# Patient Record
Sex: Male | Born: 1965 | Race: White | Hispanic: No | Marital: Married | Smoking: Never smoker
Health system: Southern US, Community
[De-identification: ages and names within clinical notes are randomized; demographics above are authoritative.]

## PROBLEM LIST (undated history)

## (undated) DIAGNOSIS — K635 Polyp of colon: Secondary | ICD-10-CM

## (undated) DIAGNOSIS — G620 Drug-induced polyneuropathy: Secondary | ICD-10-CM

## (undated) DIAGNOSIS — N200 Calculus of kidney: Secondary | ICD-10-CM

## (undated) DIAGNOSIS — E785 Hyperlipidemia, unspecified: Secondary | ICD-10-CM

## (undated) DIAGNOSIS — C20 Malignant neoplasm of rectum: Secondary | ICD-10-CM

## (undated) HISTORY — PX: TONSILLECTOMY: SUR1361

## (undated) HISTORY — DX: Drug-induced polyneuropathy: G62.0

## (undated) HISTORY — DX: Malignant neoplasm of rectum: C20

## (undated) HISTORY — DX: Calculus of kidney: N20.0

## (undated) HISTORY — PX: HERNIA REPAIR: SHX51

## (undated) HISTORY — PX: UMBILICAL HERNIA REPAIR: SHX196

## (undated) HISTORY — PX: ILEOSTOMY CLOSURE: SHX1784

## (undated) HISTORY — DX: Hyperlipidemia, unspecified: E78.5

## (undated) HISTORY — PX: PORTACATH PLACEMENT: SHX2246

## (undated) HISTORY — DX: Polyp of colon: K63.5

## (undated) HISTORY — PX: ILEOSTOMY: SHX1783

---

## 2007-02-12 DIAGNOSIS — C20 Malignant neoplasm of rectum: Secondary | ICD-10-CM

## 2007-02-12 HISTORY — DX: Malignant neoplasm of rectum: C20

## 2007-06-15 ENCOUNTER — Ambulatory Visit: Payer: Self-pay | Admitting: Gastroenterology

## 2007-06-15 DIAGNOSIS — K625 Hemorrhage of anus and rectum: Secondary | ICD-10-CM | POA: Insufficient documentation

## 2007-06-15 LAB — CONVERTED CEMR LAB
ALT: 31 units/L (ref 0–53)
AST: 24 units/L (ref 0–37)
Albumin: 3.7 g/dL (ref 3.5–5.2)
Alkaline Phosphatase: 67 units/L (ref 39–117)
BUN: 9 mg/dL (ref 6–23)
Basophils Absolute: 0 10*3/uL (ref 0.0–0.1)
Chloride: 108 meq/L (ref 96–112)
Eosinophils Absolute: 0.3 10*3/uL (ref 0.0–0.7)
Eosinophils Relative: 4 % (ref 0.0–5.0)
HCT: 35.1 % — ABNORMAL LOW (ref 39.0–52.0)
MCHC: 32.2 g/dL (ref 30.0–36.0)
MCV: 74.6 fL — ABNORMAL LOW (ref 78.0–100.0)
Monocytes Absolute: 0.5 10*3/uL (ref 0.1–1.0)
Neutrophils Relative %: 66.1 % (ref 43.0–77.0)
Platelets: 285 10*3/uL (ref 150–400)
Potassium: 3.8 meq/L (ref 3.5–5.1)
RDW: 15.2 % — ABNORMAL HIGH (ref 11.5–14.6)
Sodium: 140 meq/L (ref 135–145)

## 2007-07-16 ENCOUNTER — Ambulatory Visit: Payer: Self-pay | Admitting: Gastroenterology

## 2007-07-16 ENCOUNTER — Encounter: Payer: Self-pay | Admitting: Gastroenterology

## 2007-07-17 ENCOUNTER — Telehealth: Payer: Self-pay | Admitting: Gastroenterology

## 2007-07-20 ENCOUNTER — Encounter: Payer: Self-pay | Admitting: Gastroenterology

## 2007-07-20 ENCOUNTER — Ambulatory Visit (HOSPITAL_COMMUNITY): Admission: RE | Admit: 2007-07-20 | Discharge: 2007-07-20 | Payer: Self-pay | Admitting: Gastroenterology

## 2007-07-21 ENCOUNTER — Telehealth: Payer: Self-pay | Admitting: Gastroenterology

## 2007-07-28 ENCOUNTER — Encounter: Payer: Self-pay | Admitting: Gastroenterology

## 2007-07-31 ENCOUNTER — Ambulatory Visit: Payer: Self-pay | Admitting: Oncology

## 2007-08-03 ENCOUNTER — Ambulatory Visit: Admission: RE | Admit: 2007-08-03 | Discharge: 2007-09-30 | Payer: Self-pay | Admitting: Radiation Oncology

## 2007-08-04 ENCOUNTER — Encounter: Payer: Self-pay | Admitting: Gastroenterology

## 2007-08-11 ENCOUNTER — Ambulatory Visit (HOSPITAL_COMMUNITY): Admission: RE | Admit: 2007-08-11 | Discharge: 2007-08-11 | Payer: Self-pay | Admitting: Oncology

## 2007-08-11 ENCOUNTER — Ambulatory Visit (HOSPITAL_COMMUNITY): Admission: RE | Admit: 2007-08-11 | Discharge: 2007-08-11 | Payer: Self-pay | Admitting: Surgery

## 2007-08-17 ENCOUNTER — Encounter: Payer: Self-pay | Admitting: Gastroenterology

## 2007-08-17 ENCOUNTER — Ambulatory Visit (HOSPITAL_COMMUNITY): Admission: RE | Admit: 2007-08-17 | Discharge: 2007-08-17 | Payer: Self-pay | Admitting: Oncology

## 2007-08-17 LAB — COMPREHENSIVE METABOLIC PANEL
BUN: 13 mg/dL (ref 6–23)
CO2: 22 mEq/L (ref 19–32)
Calcium: 8.9 mg/dL (ref 8.4–10.5)
Chloride: 104 mEq/L (ref 96–112)
Creatinine, Ser: 0.71 mg/dL (ref 0.40–1.50)
Glucose, Bld: 95 mg/dL (ref 70–99)
Total Bilirubin: 0.5 mg/dL (ref 0.3–1.2)

## 2007-08-17 LAB — CBC WITH DIFFERENTIAL/PLATELET
BASO%: 1.1 % (ref 0.0–2.0)
Basophils Absolute: 0.1 10*3/uL (ref 0.0–0.1)
HCT: 29.3 % — ABNORMAL LOW (ref 38.7–49.9)
HGB: 9.5 g/dL — ABNORMAL LOW (ref 13.0–17.1)
LYMPH%: 18.6 % (ref 14.0–48.0)
MCH: 22 pg — ABNORMAL LOW (ref 28.0–33.4)
MCHC: 32.3 g/dL (ref 32.0–35.9)
MONO#: 0.6 10*3/uL (ref 0.1–0.9)
NEUT%: 68.8 % (ref 40.0–75.0)
Platelets: 285 10*3/uL (ref 145–400)
WBC: 8.2 10*3/uL (ref 4.0–10.0)
lymph#: 1.5 10*3/uL (ref 0.9–3.3)

## 2007-08-17 LAB — CEA: CEA: 0.8 ng/mL (ref 0.0–5.0)

## 2007-08-17 LAB — LACTATE DEHYDROGENASE: LDH: 172 U/L (ref 94–250)

## 2007-08-24 LAB — CBC WITH DIFFERENTIAL/PLATELET
Basophils Absolute: 0 10*3/uL (ref 0.0–0.1)
Eosinophils Absolute: 0.3 10*3/uL (ref 0.0–0.5)
HCT: 30.4 % — ABNORMAL LOW (ref 38.7–49.9)
HGB: 9.5 g/dL — ABNORMAL LOW (ref 13.0–17.1)
MCV: 69.4 fL — ABNORMAL LOW (ref 81.6–98.0)
MONO%: 8 % (ref 0.0–13.0)
NEUT#: 4 10*3/uL (ref 1.5–6.5)
RDW: 16.6 % — ABNORMAL HIGH (ref 11.2–14.6)
lymph#: 1 10*3/uL (ref 0.9–3.3)

## 2007-08-31 LAB — CBC WITH DIFFERENTIAL/PLATELET
Basophils Absolute: 0 10*3/uL (ref 0.0–0.1)
Eosinophils Absolute: 0.3 10*3/uL (ref 0.0–0.5)
HGB: 9.6 g/dL — ABNORMAL LOW (ref 13.0–17.1)
LYMPH%: 13.7 % — ABNORMAL LOW (ref 14.0–48.0)
MCV: 69.7 fL — ABNORMAL LOW (ref 81.6–98.0)
MONO%: 12 % (ref 0.0–13.0)
NEUT#: 3.4 10*3/uL (ref 1.5–6.5)
Platelets: 240 10*3/uL (ref 145–400)

## 2007-08-31 LAB — COMPREHENSIVE METABOLIC PANEL
Alkaline Phosphatase: 71 U/L (ref 39–117)
BUN: 11 mg/dL (ref 6–23)
Glucose, Bld: 113 mg/dL — ABNORMAL HIGH (ref 70–99)
Total Bilirubin: 0.4 mg/dL (ref 0.3–1.2)

## 2007-08-31 LAB — CEA: CEA: 1.2 ng/mL (ref 0.0–5.0)

## 2007-09-07 LAB — CBC WITH DIFFERENTIAL/PLATELET
BASO%: 0.9 % (ref 0.0–2.0)
Basophils Absolute: 0 10*3/uL (ref 0.0–0.1)
EOS%: 7.5 % — ABNORMAL HIGH (ref 0.0–7.0)
HGB: 9.8 g/dL — ABNORMAL LOW (ref 13.0–17.1)
MCH: 23 pg — ABNORMAL LOW (ref 28.0–33.4)
MCHC: 31.1 g/dL — ABNORMAL LOW (ref 32.0–35.9)
MCV: 73.8 fL — ABNORMAL LOW (ref 81.6–98.0)
MONO%: 10.2 % (ref 0.0–13.0)
RBC: 4.28 10*6/uL (ref 4.20–5.71)
RDW: 25.3 % — ABNORMAL HIGH (ref 11.2–14.6)

## 2007-09-14 LAB — COMPREHENSIVE METABOLIC PANEL
BUN: 13 mg/dL (ref 6–23)
CO2: 21 mEq/L (ref 19–32)
Calcium: 9 mg/dL (ref 8.4–10.5)
Chloride: 104 mEq/L (ref 96–112)
Creatinine, Ser: 0.83 mg/dL (ref 0.40–1.50)
Glucose, Bld: 107 mg/dL — ABNORMAL HIGH (ref 70–99)
Total Bilirubin: 0.4 mg/dL (ref 0.3–1.2)

## 2007-09-14 LAB — CBC WITH DIFFERENTIAL/PLATELET
BASO%: 0.6 % (ref 0.0–2.0)
HCT: 34.3 % — ABNORMAL LOW (ref 38.7–49.9)
MCHC: 32 g/dL (ref 32.0–35.9)
MONO#: 0.5 10*3/uL (ref 0.1–0.9)
RBC: 4.48 10*6/uL (ref 4.20–5.71)
RDW: 29.4 % — ABNORMAL HIGH (ref 11.2–14.6)
WBC: 5.1 10*3/uL (ref 4.0–10.0)
lymph#: 0.5 10*3/uL — ABNORMAL LOW (ref 0.9–3.3)

## 2007-09-28 ENCOUNTER — Ambulatory Visit: Payer: Self-pay | Admitting: Oncology

## 2007-10-05 LAB — CBC WITH DIFFERENTIAL/PLATELET
BASO%: 0.4 % (ref 0.0–2.0)
Eosinophils Absolute: 1.1 10*3/uL — ABNORMAL HIGH (ref 0.0–0.5)
HCT: 34.9 % — ABNORMAL LOW (ref 38.7–49.9)
LYMPH%: 9.1 % — ABNORMAL LOW (ref 14.0–48.0)
MONO#: 0.5 10*3/uL (ref 0.1–0.9)
NEUT#: 3.8 10*3/uL (ref 1.5–6.5)
NEUT%: 63.7 % (ref 40.0–75.0)
Platelets: 280 10*3/uL (ref 145–400)
WBC: 6 10*3/uL (ref 4.0–10.0)
lymph#: 0.5 10*3/uL — ABNORMAL LOW (ref 0.9–3.3)

## 2007-10-22 ENCOUNTER — Encounter (INDEPENDENT_AMBULATORY_CARE_PROVIDER_SITE_OTHER): Payer: Self-pay | Admitting: Surgery

## 2007-10-22 ENCOUNTER — Inpatient Hospital Stay (HOSPITAL_COMMUNITY): Admission: RE | Admit: 2007-10-22 | Discharge: 2007-10-25 | Payer: Self-pay | Admitting: Surgery

## 2007-10-29 ENCOUNTER — Encounter: Admission: RE | Admit: 2007-10-29 | Discharge: 2007-10-29 | Payer: Self-pay | Admitting: Surgery

## 2007-11-03 ENCOUNTER — Encounter: Payer: Self-pay | Admitting: Gastroenterology

## 2007-11-19 ENCOUNTER — Ambulatory Visit: Payer: Self-pay | Admitting: Oncology

## 2007-11-23 LAB — COMPREHENSIVE METABOLIC PANEL
ALT: 20 U/L (ref 0–53)
AST: 23 U/L (ref 0–37)
Creatinine, Ser: 0.7 mg/dL (ref 0.40–1.50)
Sodium: 138 mEq/L (ref 135–145)
Total Bilirubin: 0.6 mg/dL (ref 0.3–1.2)

## 2007-11-23 LAB — CBC WITH DIFFERENTIAL/PLATELET
BASO%: 0.6 % (ref 0.0–2.0)
LYMPH%: 9.6 % — ABNORMAL LOW (ref 14.0–48.0)
MCHC: 31.4 g/dL — ABNORMAL LOW (ref 32.0–35.9)
MCV: 86.2 fL (ref 81.6–98.0)
MONO#: 0.4 10*3/uL (ref 0.1–0.9)
MONO%: 6.7 % (ref 0.0–13.0)
NEUT#: 4 10*3/uL (ref 1.5–6.5)
Platelets: 246 10*3/uL (ref 145–400)
RBC: 4.12 10*6/uL — ABNORMAL LOW (ref 4.20–5.71)
RDW: 19.3 % — ABNORMAL HIGH (ref 11.2–14.6)
WBC: 5.2 10*3/uL (ref 4.0–10.0)

## 2007-12-07 LAB — CBC WITH DIFFERENTIAL/PLATELET
Eosinophils Absolute: 0.2 10*3/uL (ref 0.0–0.5)
HCT: 31.9 % — ABNORMAL LOW (ref 38.7–49.9)
HGB: 10.4 g/dL — ABNORMAL LOW (ref 13.0–17.1)
LYMPH%: 14.6 % (ref 14.0–48.0)
MONO#: 0.5 10*3/uL (ref 0.1–0.9)
NEUT#: 2 10*3/uL (ref 1.5–6.5)
Platelets: 199 10*3/uL (ref 145–400)
RBC: 3.83 10*6/uL — ABNORMAL LOW (ref 4.20–5.71)
WBC: 3.3 10*3/uL — ABNORMAL LOW (ref 4.0–10.0)

## 2007-12-07 LAB — URINALYSIS, MICROSCOPIC - CHCC
Glucose: NEGATIVE g/dL
Nitrite: NEGATIVE
Specific Gravity, Urine: 1.02 (ref 1.003–1.035)

## 2007-12-07 LAB — COMPREHENSIVE METABOLIC PANEL
BUN: 9 mg/dL (ref 6–23)
CO2: 25 mEq/L (ref 19–32)
Creatinine, Ser: 0.78 mg/dL (ref 0.40–1.50)
Glucose, Bld: 121 mg/dL — ABNORMAL HIGH (ref 70–99)
Total Bilirubin: 0.7 mg/dL (ref 0.3–1.2)

## 2007-12-09 ENCOUNTER — Encounter: Payer: Self-pay | Admitting: Gastroenterology

## 2007-12-21 LAB — URINALYSIS, MICROSCOPIC - CHCC
Ketones: NEGATIVE mg/dL
Nitrite: NEGATIVE
Protein: 30 mg/dL
Specific Gravity, Urine: 1.025 (ref 1.003–1.035)
pH: 6 (ref 4.6–8.0)

## 2007-12-21 LAB — COMPREHENSIVE METABOLIC PANEL
CO2: 25 mEq/L (ref 19–32)
Calcium: 8.7 mg/dL (ref 8.4–10.5)
Chloride: 107 mEq/L (ref 96–112)
Creatinine, Ser: 0.78 mg/dL (ref 0.40–1.50)
Glucose, Bld: 134 mg/dL — ABNORMAL HIGH (ref 70–99)
Total Bilirubin: 0.7 mg/dL (ref 0.3–1.2)
Total Protein: 6.5 g/dL (ref 6.0–8.3)

## 2007-12-21 LAB — CBC WITH DIFFERENTIAL/PLATELET
BASO%: 1.6 % (ref 0.0–2.0)
Basophils Absolute: 0.1 10*3/uL (ref 0.0–0.1)
HCT: 34.7 % — ABNORMAL LOW (ref 38.7–49.9)
HGB: 11.2 g/dL — ABNORMAL LOW (ref 13.0–17.1)
LYMPH%: 19.8 % (ref 14.0–48.0)
MCH: 26.9 pg — ABNORMAL LOW (ref 28.0–33.4)
MCHC: 32.2 g/dL (ref 32.0–35.9)
MONO#: 0.5 10*3/uL (ref 0.1–0.9)
NEUT%: 57.9 % (ref 40.0–75.0)
Platelets: 143 10*3/uL — ABNORMAL LOW (ref 145–400)

## 2008-01-04 ENCOUNTER — Ambulatory Visit: Payer: Self-pay | Admitting: Oncology

## 2008-01-04 LAB — CBC WITH DIFFERENTIAL/PLATELET
BASO%: 0.7 % (ref 0.0–2.0)
EOS%: 3.6 % (ref 0.0–7.0)
Eosinophils Absolute: 0.1 10*3/uL (ref 0.0–0.5)
LYMPH%: 28.9 % (ref 14.0–48.0)
MCHC: 33.7 g/dL (ref 32.0–35.9)
MCV: 82.9 fL (ref 81.6–98.0)
MONO%: 21.2 % — ABNORMAL HIGH (ref 0.0–13.0)
NEUT#: 0.9 10*3/uL — ABNORMAL LOW (ref 1.5–6.5)
Platelets: 121 10*3/uL — ABNORMAL LOW (ref 145–400)
RBC: 3.85 10*6/uL — ABNORMAL LOW (ref 4.20–5.71)
RDW: 18.6 % — ABNORMAL HIGH (ref 11.2–14.6)

## 2008-01-04 LAB — COMPREHENSIVE METABOLIC PANEL
ALT: 26 U/L (ref 0–53)
Albumin: 3.6 g/dL (ref 3.5–5.2)
Alkaline Phosphatase: 86 U/L (ref 39–117)
CO2: 27 mEq/L (ref 19–32)
Glucose, Bld: 114 mg/dL — ABNORMAL HIGH (ref 70–99)
Potassium: 3.9 mEq/L (ref 3.5–5.3)
Sodium: 139 mEq/L (ref 135–145)
Total Bilirubin: 0.7 mg/dL (ref 0.3–1.2)
Total Protein: 6.3 g/dL (ref 6.0–8.3)

## 2008-01-11 LAB — COMPREHENSIVE METABOLIC PANEL
ALT: 27 U/L (ref 0–53)
Albumin: 3.3 g/dL — ABNORMAL LOW (ref 3.5–5.2)
CO2: 26 mEq/L (ref 19–32)
Glucose, Bld: 117 mg/dL — ABNORMAL HIGH (ref 70–99)
Potassium: 3.7 mEq/L (ref 3.5–5.3)
Sodium: 136 mEq/L (ref 135–145)
Total Bilirubin: 0.9 mg/dL (ref 0.3–1.2)
Total Protein: 6.5 g/dL (ref 6.0–8.3)

## 2008-01-11 LAB — CBC WITH DIFFERENTIAL/PLATELET
BASO%: 0.7 % (ref 0.0–2.0)
Eosinophils Absolute: 0.1 10*3/uL (ref 0.0–0.5)
LYMPH%: 14.3 % (ref 14.0–48.0)
MCHC: 33.2 g/dL (ref 32.0–35.9)
MONO#: 0.6 10*3/uL (ref 0.1–0.9)
NEUT#: 2.1 10*3/uL (ref 1.5–6.5)
Platelets: 152 10*3/uL (ref 145–400)
RBC: 4.24 10*6/uL (ref 4.20–5.71)
RDW: 19.5 % — ABNORMAL HIGH (ref 11.2–14.6)
WBC: 3.4 10*3/uL — ABNORMAL LOW (ref 4.0–10.0)
lymph#: 0.5 10*3/uL — ABNORMAL LOW (ref 0.9–3.3)

## 2008-01-25 LAB — CBC WITH DIFFERENTIAL/PLATELET
Basophils Absolute: 0.1 10*3/uL (ref 0.0–0.1)
Eosinophils Absolute: 0.2 10*3/uL (ref 0.0–0.5)
HCT: 34.1 % — ABNORMAL LOW (ref 38.7–49.9)
LYMPH%: 23 % (ref 14.0–48.0)
MCV: 80.5 fL — ABNORMAL LOW (ref 81.6–98.0)
MONO%: 20.5 % — ABNORMAL HIGH (ref 0.0–13.0)
NEUT#: 1.6 10*3/uL (ref 1.5–6.5)
NEUT%: 49.4 % (ref 40.0–75.0)
Platelets: 133 10*3/uL — ABNORMAL LOW (ref 145–400)
RBC: 4.24 10*6/uL (ref 4.20–5.71)

## 2008-01-25 LAB — COMPREHENSIVE METABOLIC PANEL
Alkaline Phosphatase: 80 U/L (ref 39–117)
BUN: 6 mg/dL (ref 6–23)
Creatinine, Ser: 0.62 mg/dL (ref 0.40–1.50)
Glucose, Bld: 103 mg/dL — ABNORMAL HIGH (ref 70–99)
Sodium: 137 mEq/L (ref 135–145)
Total Bilirubin: 0.7 mg/dL (ref 0.3–1.2)

## 2008-02-08 LAB — CBC WITH DIFFERENTIAL/PLATELET
Basophils Absolute: 0 10*3/uL (ref 0.0–0.1)
Eosinophils Absolute: 0.1 10*3/uL (ref 0.0–0.5)
HCT: 35.4 % — ABNORMAL LOW (ref 38.7–49.9)
HGB: 11.8 g/dL — ABNORMAL LOW (ref 13.0–17.1)
LYMPH%: 24.4 % (ref 14.0–48.0)
MCV: 82 fL (ref 81.6–98.0)
MONO#: 0.3 10*3/uL (ref 0.1–0.9)
MONO%: 18.3 % — ABNORMAL HIGH (ref 0.0–13.0)
NEUT#: 0.9 10*3/uL — ABNORMAL LOW (ref 1.5–6.5)
Platelets: 84 10*3/uL — ABNORMAL LOW (ref 145–400)
WBC: 1.8 10*3/uL — ABNORMAL LOW (ref 4.0–10.0)

## 2008-02-12 DIAGNOSIS — G620 Drug-induced polyneuropathy: Secondary | ICD-10-CM

## 2008-02-12 HISTORY — DX: Drug-induced polyneuropathy: G62.0

## 2008-02-12 HISTORY — PX: COLON SURGERY: SHX602

## 2008-02-16 LAB — CBC WITH DIFFERENTIAL/PLATELET
Basophils Absolute: 0 10*3/uL (ref 0.0–0.1)
EOS%: 4.7 % (ref 0.0–7.0)
Eosinophils Absolute: 0.2 10*3/uL (ref 0.0–0.5)
HCT: 37.8 % — ABNORMAL LOW (ref 38.7–49.9)
HGB: 12.4 g/dL — ABNORMAL LOW (ref 13.0–17.1)
MCH: 27.3 pg — ABNORMAL LOW (ref 28.0–33.4)
NEUT#: 2.6 10*3/uL (ref 1.5–6.5)
NEUT%: 65.3 % (ref 40.0–75.0)
RDW: 19.4 % — ABNORMAL HIGH (ref 11.2–14.6)
lymph#: 0.5 10*3/uL — ABNORMAL LOW (ref 0.9–3.3)

## 2008-02-16 LAB — COMPREHENSIVE METABOLIC PANEL
AST: 29 U/L (ref 0–37)
Albumin: 3.3 g/dL — ABNORMAL LOW (ref 3.5–5.2)
BUN: 9 mg/dL (ref 6–23)
CO2: 22 mEq/L (ref 19–32)
Calcium: 8.6 mg/dL (ref 8.4–10.5)
Chloride: 104 mEq/L (ref 96–112)
Creatinine, Ser: 0.74 mg/dL (ref 0.40–1.50)
Glucose, Bld: 128 mg/dL — ABNORMAL HIGH (ref 70–99)
Potassium: 3.6 mEq/L (ref 3.5–5.3)

## 2008-02-25 ENCOUNTER — Ambulatory Visit: Payer: Self-pay | Admitting: Oncology

## 2008-02-29 LAB — CBC WITH DIFFERENTIAL/PLATELET
BASO%: 0 % (ref 0.0–2.0)
Eosinophils Absolute: 0.2 10*3/uL (ref 0.0–0.5)
HCT: 36.3 % — ABNORMAL LOW (ref 38.7–49.9)
HGB: 12 g/dL — ABNORMAL LOW (ref 13.0–17.1)
LYMPH%: 12.6 % — ABNORMAL LOW (ref 14.0–48.0)
MCH: 27.5 pg — ABNORMAL LOW (ref 28.0–33.4)
MCHC: 33.1 g/dL (ref 32.0–35.9)
MONO%: 12.5 % (ref 0.0–13.0)
NEUT#: 2.3 10*3/uL (ref 1.5–6.5)
RBC: 4.38 10*6/uL (ref 4.20–5.71)
WBC: 3.4 10*3/uL — ABNORMAL LOW (ref 4.0–10.0)

## 2008-02-29 LAB — COMPREHENSIVE METABOLIC PANEL
Alkaline Phosphatase: 81 U/L (ref 39–117)
Glucose, Bld: 133 mg/dL — ABNORMAL HIGH (ref 70–99)
Sodium: 138 mEq/L (ref 135–145)
Total Bilirubin: 0.7 mg/dL (ref 0.3–1.2)
Total Protein: 6.7 g/dL (ref 6.0–8.3)

## 2008-03-14 LAB — CBC WITH DIFFERENTIAL/PLATELET
BASO%: 0.2 % (ref 0.0–2.0)
Eosinophils Absolute: 0.1 10*3/uL (ref 0.0–0.5)
MCV: 83.2 fL (ref 81.6–98.0)
MONO%: 15.3 % — ABNORMAL HIGH (ref 0.0–13.0)
NEUT#: 2 10*3/uL (ref 1.5–6.5)
RBC: 4.3 10*6/uL (ref 4.20–5.71)
RDW: 18.8 % — ABNORMAL HIGH (ref 11.2–14.6)
WBC: 2.9 10*3/uL — ABNORMAL LOW (ref 4.0–10.0)

## 2008-03-14 LAB — COMPREHENSIVE METABOLIC PANEL
ALT: 19 U/L (ref 0–53)
AST: 29 U/L (ref 0–37)
Albumin: 3.3 g/dL — ABNORMAL LOW (ref 3.5–5.2)
Alkaline Phosphatase: 95 U/L (ref 39–117)
Glucose, Bld: 128 mg/dL — ABNORMAL HIGH (ref 70–99)
Potassium: 3.8 mEq/L (ref 3.5–5.3)
Sodium: 139 mEq/L (ref 135–145)
Total Protein: 6.3 g/dL (ref 6.0–8.3)

## 2008-03-21 ENCOUNTER — Encounter: Admission: RE | Admit: 2008-03-21 | Discharge: 2008-03-21 | Payer: Self-pay | Admitting: Surgery

## 2008-03-28 LAB — COMPREHENSIVE METABOLIC PANEL
AST: 39 U/L — ABNORMAL HIGH (ref 0–37)
Albumin: 3.4 g/dL — ABNORMAL LOW (ref 3.5–5.2)
Alkaline Phosphatase: 87 U/L (ref 39–117)
Potassium: 3.1 mEq/L — ABNORMAL LOW (ref 3.5–5.3)
Sodium: 136 mEq/L (ref 135–145)
Total Bilirubin: 1.3 mg/dL — ABNORMAL HIGH (ref 0.3–1.2)
Total Protein: 6.8 g/dL (ref 6.0–8.3)

## 2008-03-28 LAB — CBC WITH DIFFERENTIAL/PLATELET
EOS%: 4.9 % (ref 0.0–7.0)
MCH: 27.6 pg — ABNORMAL LOW (ref 28.0–33.4)
MCHC: 33.6 g/dL (ref 32.0–35.9)
MCV: 82.2 fL (ref 81.6–98.0)
MONO%: 16.5 % — ABNORMAL HIGH (ref 0.0–13.0)
NEUT#: 1.5 10*3/uL (ref 1.5–6.5)
RBC: 3.97 10*6/uL — ABNORMAL LOW (ref 4.20–5.71)
RDW: 19.2 % — ABNORMAL HIGH (ref 11.2–14.6)

## 2008-04-04 LAB — COMPREHENSIVE METABOLIC PANEL
AST: 31 U/L (ref 0–37)
Alkaline Phosphatase: 78 U/L (ref 39–117)
BUN: 8 mg/dL (ref 6–23)
Calcium: 9 mg/dL (ref 8.4–10.5)
Chloride: 108 mEq/L (ref 96–112)
Creatinine, Ser: 0.71 mg/dL (ref 0.40–1.50)
Glucose, Bld: 147 mg/dL — ABNORMAL HIGH (ref 70–99)

## 2008-04-25 ENCOUNTER — Inpatient Hospital Stay (HOSPITAL_COMMUNITY): Admission: RE | Admit: 2008-04-25 | Discharge: 2008-04-27 | Payer: Self-pay | Admitting: Surgery

## 2008-05-09 ENCOUNTER — Ambulatory Visit: Payer: Self-pay | Admitting: Oncology

## 2008-05-12 LAB — BASIC METABOLIC PANEL
BUN: 9 mg/dL (ref 6–23)
Chloride: 103 mEq/L (ref 96–112)
Creatinine, Ser: 0.77 mg/dL (ref 0.40–1.50)

## 2008-05-18 ENCOUNTER — Encounter: Payer: Self-pay | Admitting: Gastroenterology

## 2008-06-23 ENCOUNTER — Ambulatory Visit: Payer: Self-pay | Admitting: Oncology

## 2008-06-23 LAB — BASIC METABOLIC PANEL
BUN: 11 mg/dL (ref 6–23)
CO2: 26 mEq/L (ref 19–32)
Chloride: 106 mEq/L (ref 96–112)
Creatinine, Ser: 0.73 mg/dL (ref 0.40–1.50)

## 2008-07-04 ENCOUNTER — Encounter: Admission: RE | Admit: 2008-07-04 | Discharge: 2008-07-04 | Payer: Self-pay | Admitting: Oncology

## 2008-07-14 ENCOUNTER — Encounter (INDEPENDENT_AMBULATORY_CARE_PROVIDER_SITE_OTHER): Payer: Self-pay | Admitting: *Deleted

## 2008-10-24 ENCOUNTER — Ambulatory Visit: Payer: Self-pay | Admitting: Gastroenterology

## 2008-11-14 ENCOUNTER — Encounter: Payer: Self-pay | Admitting: Gastroenterology

## 2008-11-14 ENCOUNTER — Ambulatory Visit: Payer: Self-pay | Admitting: Gastroenterology

## 2008-11-16 ENCOUNTER — Encounter: Payer: Self-pay | Admitting: Gastroenterology

## 2008-11-24 ENCOUNTER — Ambulatory Visit: Payer: Self-pay | Admitting: Oncology

## 2009-01-19 ENCOUNTER — Ambulatory Visit: Payer: Self-pay | Admitting: Oncology

## 2009-01-23 LAB — CBC WITH DIFFERENTIAL/PLATELET
Basophils Absolute: 0 10*3/uL (ref 0.0–0.1)
Eosinophils Absolute: 0.3 10*3/uL (ref 0.0–0.5)
HCT: 37.1 % — ABNORMAL LOW (ref 38.4–49.9)
LYMPH%: 15.3 % (ref 14.0–49.0)
MONO#: 0.5 10*3/uL (ref 0.1–0.9)
NEUT#: 3.3 10*3/uL (ref 1.5–6.5)
NEUT%: 69.1 % (ref 39.0–75.0)
Platelets: 213 10*3/uL (ref 140–400)
WBC: 4.7 10*3/uL (ref 4.0–10.3)

## 2009-02-27 ENCOUNTER — Other Ambulatory Visit: Payer: Self-pay | Admitting: Oncology

## 2009-02-27 ENCOUNTER — Ambulatory Visit: Payer: Self-pay | Admitting: Oncology

## 2009-02-27 LAB — URINALYSIS, MICROSCOPIC - CHCC
Ketones: NEGATIVE mg/dL
Protein: 30 mg/dL
Specific Gravity, Urine: 1.03 (ref 1.003–1.035)

## 2009-03-01 LAB — URINE CULTURE

## 2009-03-21 ENCOUNTER — Encounter: Payer: Self-pay | Admitting: Gastroenterology

## 2009-06-16 ENCOUNTER — Ambulatory Visit: Payer: Self-pay | Admitting: Oncology

## 2009-06-19 ENCOUNTER — Ambulatory Visit (HOSPITAL_COMMUNITY): Admission: RE | Admit: 2009-06-19 | Discharge: 2009-06-19 | Payer: Self-pay | Admitting: Oncology

## 2009-06-19 LAB — CBC WITH DIFFERENTIAL/PLATELET
Eosinophils Absolute: 0.2 10*3/uL (ref 0.0–0.5)
HGB: 12.4 g/dL — ABNORMAL LOW (ref 13.0–17.1)
MONO#: 0.4 10*3/uL (ref 0.1–0.9)
NEUT#: 3.3 10*3/uL (ref 1.5–6.5)
Platelets: 239 10*3/uL (ref 140–400)
RBC: 4.01 10*6/uL — ABNORMAL LOW (ref 4.20–5.82)
RDW: 17.1 % — ABNORMAL HIGH (ref 11.0–14.6)
WBC: 4.6 10*3/uL (ref 4.0–10.3)

## 2009-06-19 LAB — COMPREHENSIVE METABOLIC PANEL
Albumin: 4.1 g/dL (ref 3.5–5.2)
CO2: 27 mEq/L (ref 19–32)
Calcium: 9.2 mg/dL (ref 8.4–10.5)
Chloride: 104 mEq/L (ref 96–112)
Glucose, Bld: 113 mg/dL — ABNORMAL HIGH (ref 70–99)
Potassium: 3.7 mEq/L (ref 3.5–5.3)
Sodium: 137 mEq/L (ref 135–145)
Total Protein: 7.6 g/dL (ref 6.0–8.3)

## 2009-06-19 LAB — CEA: CEA: 1.7 ng/mL (ref 0.0–5.0)

## 2009-07-17 ENCOUNTER — Ambulatory Visit: Payer: Self-pay | Admitting: Oncology

## 2010-01-11 ENCOUNTER — Ambulatory Visit: Payer: Self-pay | Admitting: Oncology

## 2010-02-15 ENCOUNTER — Ambulatory Visit: Payer: Self-pay | Admitting: Oncology

## 2010-02-19 ENCOUNTER — Encounter: Payer: Self-pay | Admitting: Gastroenterology

## 2010-02-19 LAB — TESTOSTERONE: Testosterone: 192.26 ng/dL — ABNORMAL LOW (ref 250–890)

## 2010-02-19 LAB — CEA: CEA: 1.1 ng/mL (ref 0.0–5.0)

## 2010-02-19 LAB — CBC WITH DIFFERENTIAL/PLATELET
BASO%: 0.2 % (ref 0.0–2.0)
Basophils Absolute: 0 10*3/uL (ref 0.0–0.1)
EOS%: 6 % (ref 0.0–7.0)
Eosinophils Absolute: 0.2 10*3/uL (ref 0.0–0.5)
HCT: 37.5 % — ABNORMAL LOW (ref 38.4–49.9)
HGB: 12.4 g/dL — ABNORMAL LOW (ref 13.0–17.1)
LYMPH%: 18.1 % (ref 14.0–49.0)
MCH: 30.6 pg (ref 27.2–33.4)
MCHC: 33.1 g/dL (ref 32.0–36.0)
MCV: 92.4 fL (ref 79.3–98.0)
MONO#: 0.2 10*3/uL (ref 0.1–0.9)
MONO%: 5.9 % (ref 0.0–14.0)
NEUT#: 2.9 10*3/uL (ref 1.5–6.5)
NEUT%: 69.8 % (ref 39.0–75.0)
Platelets: 224 10*3/uL (ref 140–400)
RBC: 4.06 10*6/uL — ABNORMAL LOW (ref 4.20–5.82)
RDW: 19 % — ABNORMAL HIGH (ref 11.0–14.6)
WBC: 4.2 10*3/uL (ref 4.0–10.3)
lymph#: 0.7 10*3/uL — ABNORMAL LOW (ref 0.9–3.3)

## 2010-03-02 ENCOUNTER — Other Ambulatory Visit: Payer: Self-pay | Admitting: Oncology

## 2010-03-02 DIAGNOSIS — C20 Malignant neoplasm of rectum: Secondary | ICD-10-CM

## 2010-03-04 ENCOUNTER — Encounter: Payer: Self-pay | Admitting: Oncology

## 2010-03-04 ENCOUNTER — Encounter: Payer: Self-pay | Admitting: Gastroenterology

## 2010-03-13 NOTE — Letter (Signed)
Summary: [facility]  [facility]   Imported By: Lester Saratoga 03/31/2009 10:31:55  _____________________________________________________________________  External Attachment:    Type:   Image     Comment:   External Document

## 2010-03-15 NOTE — Letter (Signed)
Summary: [facility]  [facility]   Imported By: Sherian Rein 03/09/2010 09:03:14  _____________________________________________________________________  External Attachment:    Type:   Image     Comment:   External Document

## 2010-05-03 ENCOUNTER — Encounter (HOSPITAL_BASED_OUTPATIENT_CLINIC_OR_DEPARTMENT_OTHER): Payer: 59 | Admitting: Oncology

## 2010-05-03 DIAGNOSIS — C20 Malignant neoplasm of rectum: Secondary | ICD-10-CM

## 2010-05-24 ENCOUNTER — Telehealth: Payer: Self-pay | Admitting: *Deleted

## 2010-05-24 LAB — CBC
MCHC: 33 g/dL (ref 30.0–36.0)
RBC: 3.97 MIL/uL — ABNORMAL LOW (ref 4.22–5.81)
WBC: 2.9 10*3/uL — ABNORMAL LOW (ref 4.0–10.5)

## 2010-05-24 LAB — BASIC METABOLIC PANEL
CO2: 26 mEq/L (ref 19–32)
Calcium: 9.2 mg/dL (ref 8.4–10.5)
Creatinine, Ser: 0.75 mg/dL (ref 0.4–1.5)
GFR calc Af Amer: >60 mL/min (ref >60)
GFR calc non Af Amer: >60 mL/min (ref >60)

## 2010-05-24 NOTE — Telephone Encounter (Signed)
Pt is coming by tomorrow to get Dr Cato Mulligan to sign handicap form

## 2010-05-24 NOTE — Telephone Encounter (Signed)
Needs to speak to Arline Asp about when he can get his handicapped sticker signed.

## 2010-06-04 ENCOUNTER — Encounter (HOSPITAL_BASED_OUTPATIENT_CLINIC_OR_DEPARTMENT_OTHER): Payer: 59 | Admitting: Oncology

## 2010-06-04 DIAGNOSIS — R209 Unspecified disturbances of skin sensation: Secondary | ICD-10-CM

## 2010-06-04 DIAGNOSIS — G620 Drug-induced polyneuropathy: Secondary | ICD-10-CM

## 2010-06-04 DIAGNOSIS — C20 Malignant neoplasm of rectum: Secondary | ICD-10-CM

## 2010-06-26 NOTE — Op Note (Signed)
NAME:  Adam Wilson, Adam Wilson NO.:  [id number]   MEDICAL RECORD NO.:  [id number]          PATIENT TYPE:  INP   LOCATION:  0006                         FACILITY:  [facility]   PHYSICIAN:  Ardeth Sportsman, MD     DATE OF BIRTH:  Jun 26, 1965   DATE OF PROCEDURE:  04/25/2008  DATE OF DISCHARGE:                               OPERATIVE REPORT   GASTROENTEROLOGIST:  Barbette Hair. Arlyce Dice, MD,FACG   ONCOLOGIST:  Leighton Roach. Truett Perna, M.D.   RADIATION ONCOLOGIST:  Durene Romans. Lestine Box, MD   PREOPERATIVE DIAGNOSES:  Stage III rectal cancer, pT2, pN1, M0  adenocarcinoma of the rectum, status post neoadjuvant chemoradiation  therapy, laparoscopically assisted low anterior resection with loop  ileostomy, October 22, 2007; post adjuvant chemotherapy.   POSTOPERATIVE DIAGNOSES:  Stage III rectal cancer, pT2, pN1, pM0  adenocarcinoma of the rectum, status post neoadjuvant chemoradiation  therapy, laparoscopically assisted low anterior resection with loop  ileostomy, October 22, 2007; post adjuvant chemotherapy.   OPERATION PERFORMED:  Loop ileostomy takedown.   SURGEON:  Ardeth Sportsman, MD   ASSISTANT:  Velora Heckler, MD   ANESTHESIA:  1. General anesthesia.  2. Local anesthetic in a field block.   SPECIMENS:  Loop ileostomy (not sent).   DRAINS:  None.   ESTIMATED BLOOD LOSS:  Less than 15 mL.   COMPLICATIONS:  None apparent.   INDICATIONS FOR PROCEDURE:  Mr. Donald Pore is a pleasant 45 year old male  who was diagnosed with a rectal cancer.  He underwent resection and  treatment as above.  He seems to have recovered well from this.  He had  a followup barium enema which showed no evidence of any anastomotic leak  or stricture.  Recommendations made for loop ileostomy takedown to  return to normal continence.  Risks, benefits and alternatives were  discussed in detail.  Questions were answered and he agreed to proceed.   OPERATIVE FINDINGS:  He had no evidence of any significant  anterior  abdominal adhesions nor any incisional hernia.   DESCRIPTION OF PROCEDURE:  Informed consent was confirmed.  The patient  underwent general anesthesia without any difficulty.  He had a Foley  catheter sterilely placed.  He was positioned supine with his arms out.  His abdomen was prepped and draped in sterile fashion.  A surgical time  out confirmed our plan.   A biconcave elliptical horizontal incision was made around the  ileostomy.  Cautery was used to get through the subcutaneous tissues.  I  was able to help free the loop of ileum off the subcutaneous attachment  until we got to the level of the fascia.  I was able to counter through  the fascia out laterally and carefully come around the attachments in  the fascial defect to the loop of ileum.  Gradually we were able to free  this off using some blunt and sharp and focused cautery dissection.  With that, we were able to completely eviscerate about a foot and a half  of small intestine.  Then debridement was done on the serosa to  decompress the distal end  to get good length.   A side-to-side stapled anastomosis was performed using a staple GIA-75.  The common defect was closed using a TA-60 stapler.  About 4 inches of  loop of ileum were trimmed off that had been somewhat beaten up  including the ostomy itself.  Mesentery was trimmed off using clamps and  silk ties.  Mesenteric defect was closed using 2-0 silk figure-of-eight  stitches.  Hemostasis was excellent.  The anastomosis was patent.  There  were no mesenteric defects.  The loop was allowed to fall back in.   Copious irrigation was done once hemostasis was ensured in the abdominal  wound.  The incision in the fascial defect was closed using #1 looped  PDS in a running fashion with good result.  Copious irrigation of over 1  L of saline was done with clear return.  Hemostasis was excellent.  The  skin was reapproximated using 3-0 Monocryl interrupted stitches  x3.  The  wound was packed with some Telfa wicks and triple antibiotic ointment  was placed.  Sterile dressings were applied.   The patient was extubated and sent to recovery room in stable condition.  I discussed postoperative care with the patient in detail just prior to  surgery and his wife just after surgery.      Ardeth Sportsman, MD  Electronically Signed     SCG/MEDQ  D:  04/25/2008  T:  04/25/2008  Job:  [id number]   cc:   Barbette Hair. Arlyce Dice, MD,FACG  [address]  [place]  [place] [address]   Leighton Roach Truett Perna, M.D.  Fax: [phone]   Durene Romans. Lestine Box, MD  Fax: [phone]

## 2010-06-26 NOTE — Op Note (Signed)
NAME:  ERHARDT, DADA NO.:  [id number]   MEDICAL RECORD NO.:  [id number]          PATIENT TYPE:  OUT   LOCATION:  CATS                         FACILITY:  [facility]   PHYSICIAN:  Ardeth Sportsman, MD     DATE OF BIRTH:  07-28-1965   DATE OF PROCEDURE:  DATE OF DISCHARGE:                               OPERATIVE REPORT   PRIMARY CARE PHYSICIAN:  Does not have.   GASTROENTEROLOGIST:  Melvia Heaps.   RADIATION ONCOLOGIST:  Dr. Lestine Box.   MEDICATION ONCOLOGIST:  Leighton Roach. Truett Perna, M.D.   SURGEON:  Ardeth Sportsman, MD.   ASSISTANT:  Ovidio Kin.   PREOPERATIVE DIAGNOSIS:  Rectal cancer, need for preoperative  chemotherapy.   POSTOPERATIVE DIAGNOSIS:  Rectal cancer, need for preoperative  chemotherapy.   PROCEDURE PERFORMED:  Placement of Port-A-Cath.   ANESTHESIA:  1. General anesthesia.  2. Local anesthetic in a field block.   SPECIMENS:  None.   DRAINS:  An 8-French Power Port catheter goes from left internal jugular  vein with the tip in the proximal SVC.   ESTIMATED BLOOD LOSS:  30 mL.   COMPLICATIONS:  None apparent.   INDICATIONS:  Mr. Mainwaring is a 45 year old morbidly obese male with  rectal bleeding.  He was found to have a large  rectal cancer.  I felt  he would benefit from preoperative chemoradiation therapy and Drs.  Truett Perna and Lestine Box agreed and requested [employer] catheter placement.  The technique and placement were discussed under ultrasound and  radiographic guidance.  The risks, benefits, and alternatives were  discussed.  Questions were answered.  He agreed to proceed.   INTRAOPERATIVE FINDINGS:  He had very large intrathoracic vessels.  Initially they flipped up into right subclavian system but did flip back  down into the superior vena cava and rested at the proximal SVC.   The 8-French Power port catheter reference number is [id number].  Lot  number is [id number], expiration 07/19/2011.   DESCRIPTION OF PROCEDURE:  Consent was  confirmed.  The patient, because  severe anxiety and other issues plus doing a transrectal ultrasound at  the same time, recommendation was made for the patient, this was done  under general anesthesia.  He underwent general anesthesia without  difficulty.  He received IV cefazolin and IV Flagyl just prior to  incision.  He was positioned supine with the left arm tucked with a  large roll under his shoulder blades to accentuate his chest and allow  gentle neck flexion.  His left neck and chest were clipped and draped in  a sterile fashion.   Ultrasound was used to locate the left internal jugular vein.  Venipuncture was performed.  A wire was passed down under fluoroscopy.  Initially the wire curved in on itself and after even ablation I could  not get it to pass down.  Therefore we stopped and did a re-puncture  under ultrasound.  The wire passed under fluoro down into the right  ventricle without difficulty.  The 8-French Power Port was chosen.  A 2-  cm incision was made in the  infraclavicular region near the mid to  lateral clavicular line.  Dissection was done to make a subcutaneous  pocket.  The Power Port was secured to the catheter and secured at the  anterior fascia in 3 places using a 2-0 Prolene stitch.  The catheter  was tunneled out toward the neck incision and passed through a dilator  and sheath after appropriate intra-arterial length was done by using a  fluoroscopy and a guidewire.   On final fluoroscopy the tip was floating up and was going over to the  right subclavian.  Attempts to flush and readjust would not work.  Therefore, the port was removed out of the wound and released off the  catheter.  A guidewire was passed down the catheter and we could advance  it into the right atrium, however, could not get the catheter to  readvance.  Therefore, I removed the port catheter out of the  infraclavicular wound to the left neck wound and opened up the incision  about a  centimeter.  A stiffer guide wire was used and this was advanced  and I definitely went down into the right atrium.  the I re-tunneled the  catheter back into the infraclavicular wound.  We reattached the port.  The tip was then down in the distal superior vena cava.  The catheter  was secured in 3 places.  I aspirated and flushed.  On reinspection the  tip was in the proximal SVC.  It aspirated and flushed well.  A final  flush of heparin was done.  The wound was closed using a 4-0 Monocryl  deep and interrupted subcuticular stitches.  A sterile dressing was  applied.   At this point I turned the case over to Dr. Ezzard Standing to do the transrectal  ultrasound.  His is UT3, UT1.  See his note for further details.  I was  present for that entire case as well.      Ardeth Sportsman, MD  Electronically Signed     SCG/MEDQ  D:  08/11/2007  T:  08/11/2007  Job:  [id number]

## 2010-06-26 NOTE — Op Note (Signed)
NAME:  Adam Wilson, Adam Wilson               ACCOUNT NO.:  [id number]   MEDICAL RECORD NO.:  [id number]          PATIENT TYPE:  AMB   LOCATION:  DAY                          [facility]ACILITY:  [facility]   PHYSICIAN:  Sandria Bales. Ezzard Standing, M.D.  DATE OF BIRTH:  07-Aug-1965   DATE OF PROCEDURE:  08/11/2007  DATE OF DISCHARGE:                               OPERATIVE REPORT   Date of Surgery ??   PREOPERATIVE DIAGNOSIS:  A rectal cancer.   PREOPERATIVE DIAGNOSIS:  A uT3 uN1 carcinoma of the rectum starting at 9  cm from the anal verge, going to 16 cm from the anal verge, involving  the anterior 25% of the rectum, with least one abnormal lymph node  measuring 2 cm at the right posterior aspect of the rectum 17 cm from  the anal verge.   PROCEDURES:  1. Examination under anesthesia.  2. Transrectal ultrasound.   (Dr. Michaell Cowing will dictate a Port-A-Cath placement on the patient.)   SURGEON:  Sandria Bales. Ezzard Standing, MD   FIRST ASSISTANT:  Ardeth Sportsman, MD   ANESTHESIA:  General endotracheal.   ESTIMATED BLOOD LOSS:  Minimal.   PROCEDURE:  [name] is a 45 year old white male who is a patient of  Dr. Barnet Pall, who was noted to have some rectal bleeding.  He had a  colonoscopy.  Dr. Arlyce Dice found a lesion at 12 cm from the anus and  biopsies were consistent with adenocarcinoma.   Dr. Michaell Cowing is planing to put a Port-A-Cath in him for consideration for  neoadjuvant chemotherapy.  I am doing a transrectal ultrasound at the  same time for evaluation of the size and depth of the tumor.   On CT scan the patient had three suspicious lymph nodes in his pelvis.   PROCEDURE NOTE:  After Dr. Michaell Cowing had completed his left IJ Port-A-Cath,  the patient was placed in the lithotomy position.  On digital rectal  exam you could feel this tumor on exam.  It is really above the prostate  and seemed to be based anterior.   I then did a transrectal ultrasound, identified the top of the tumor at  between 16-17 cm from the anal  verge.  The tumor penetrated to a depth  of 2.5 cm anteriorly.  The bottom of the tumor was approximately 9-10 cm  from the anal verge and was immediately above the prostate or cranial to  the prostate gland.   I also identified a 2 cm lymph node right and posterior to the rectum at  17 cm from the anal verge.   I did not see the other two large nodes identified on CT scan.  They  were probably higher and out of the reach of the rectal ultrasound.   The patient tolerated the procedure well.  Dr. Michaell Cowing will dictate the  Port-A-Cath portion, I have dictated the ultrasound portion.      Sandria Bales. Ezzard Standing, M.D.  Electronically Signed     DHN/MEDQ  D:  08/11/2007  T:  08/11/2007  Job:  [id number]   cc:   Barbette Hair. Arlyce Dice, MD,FACG  [address]  [place]  [place] [address]   Leighton Roach Truett Perna, M.D.  Fax: [phone]   Leonides Grills, M.D.  Fax: [phone]

## 2010-06-26 NOTE — Op Note (Signed)
NAME:  Adam Wilson, SAO NO.:  [id number]   MEDICAL RECORD NO.:  [id number]          PATIENT TYPE:  INP   LOCATION:  1536                         FACILITY:  [facility]   PHYSICIAN:  Ardeth Sportsman, MD     DATE OF BIRTH:  09/25/1965   DATE OF PROCEDURE:  DATE OF DISCHARGE:                               OPERATIVE REPORT   PRIMARY CARE PHYSICIAN:  Not available.   GASTROENTEROLOGIST:  Barbette Hair. Arlyce Dice, MD,FACG.   MEDICAL ONCOLOGIST:  Leighton Roach. Truett Perna, M.D.   RADIATION ONCOLOGIST:  Durene Romans. Lestine Box, M.D.   PREOPERTIVE DIAGNOSES:  1. 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  2. Umbilical hernia, not incarcerated.  3. Ileal polyposis.   POSTOPERATIVE DIAGNOSES:  1. 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  2. Umbilical hernia, not incarcerated.  3. Ileal polyposis.   PROCEDURE PERFORMED:  1. Laparoscopic splenic flexure mobilization of the colon.  2. Laparoscopic low anterior resection with 2-3 EEA low stapled      anastomosis.  3. Laparoscopic-assisted diverting loop ileostomy.  4. Primary umbilical hernia repair with 0 Vicryl figure-of-eight      stitch.  5. Partial ileal wall resection with polyps for biopsy.   SPECIMENS:  1. Rectosigmoid.  The proximal end is open.  The distal end is      stapled.  Frozen section was sent off, and the margin was negative.  2. Ileal wall with some small 1-2 mm polyps.  3. Anastomotic rings with blue stitches in the proximal ring.  The      distal ring should have staples in it.   DRAINS:  A 19 [place] Blake drain goes from the left lower quadrant down  the left gutter into the pelvis.   ESTIMATED BLOOD LOSS:  300 ml.   COMPLICATIONS:  None apparent.   INDICATIONS:  Mr. Adam Wilson is a pleasant 45 year old morbidly obese male  who was found to have a rectal bleeding.  He underwent colonoscopy and  was  found to have a mass which was thought to be possible sigmoid versus  rectum.  Dr. Arlyce Dice sent the patient to me for consultation.  I could  palpate the mass and thought it was at least in the mid rectum.  CT scan  had confirmed this.  A preoperative ultrasound was done, which was  concerning  for uT3, uN1 cancer.  He underwent preoperative neoadjuvant  therapy.  Recommendations were made for resection.   TECHNIQUE:  Laparoscopically assisted versus open rectal resection was  discussed.  The need for diverting ileostomy was discussed.  Risks,  benefits and alternatives were discussed in details.  Questions are  answered.  He and his wife agreed to proceed.   OPERATIVE FINDINGS:  His rectal tumor was from 8-10 cm at the anal verge  by rigid proctoscopy.  It had scarred down.  Distal margin on frozen was  negative.  The margin was close on the mass, but the anastomotic reading  gave  an extra centimeter as well.  A clinical 2 cm distal margin.  I  found that we were able to get good circumferential margins.   There is no evidence of any metastatic disease.  He had about a 1.5 cm  periumbilical hernia that was not incarcerated.  Next, he had numerous  small polyps in his ileum, slightly pedunculated, I would say about 1 x  3 cm columnar polyps carpeting his ileum.  This had not been seen in the  colon, per Dr. Marzetta Board report.   DESCRIPTION OF PROCEDURE:  Informed consent was confirmed.  Patient  underwent general anesthesia without any difficulty.  He had a bowel  prep, given the fact that it was a left-sided lesion.  He received IV  antibiotics.  He had sequential compression devices active during the  entire case.  He had Entereg preoperatively.  He was positioned in low  lithotomy with arms tucked.   A rigid proctoscopy was performed.  I was able to measure the tumor,  which was pretty much in the right anterior wall, mobile.  I went ahead  and marked it with some black [place] ink  type of marking  circumferentially.  I was able to evacuate excessive gas.   The abdomen and perineum were prepped and draped in a sterile fashion.  A final port was placed in the right upper quadrant with the patient in  steep, reverse Trendelenburg, right side up, using Optiview technique.  Under direct visualization, 5 mm ports were placed in the right mid  abdomen, right lower quadrant, and left lower quadrant.  A 12 mm port  was placed through the umbilical hernia in the periumbilical region.   Camera inspection revealed no evidence of metastatic disease.  Patient  was positioned head-down and right-side down.  He was able to score the  left colon mesentery from the ligament of Treitz all the way to the  peritoneal reflection on the right side.  The sigmoid mesentery was  relieved anteriorly, including the IMA and IMV.  I could get in the  retromesenteric plane or Toldt's fascia.  I was able to quickly identify  the left ureter.  It was kept posterior during the entire case.  A  retromesenteric dissection was carried along the sigmoid and descending  colon up towards the splenic flexure.  The inferior mesenteric artery  was isolated and ligated using LigaSure after confirming the aorta was  safe.  Further dissection was done up, and the IMV was ligated at the  ligament of Treitz as well.  Further mobilization was done near the  retromesenteric fascia for good results.   The left colon was mobilized in a lateral medial fashion at the  rectosigmoid junction, scoring the colon on the lateral to the sigmoid  and freeing it off the side wall.  I followed it up to the splenic  flexure.  The greater omentum was rather thick.  I went ahead and placed  a Joe port and a Pfannenstiel incision and used that to help provide  good traction at the splenic flexure.  I was able to mobilize the  splenic flexure retromesenteric and lateral in splenic flexure  attachments using cautery and focused  the LigaSure to good results.  The  transverse colon was freed off the greater omentum and then completely  mobilized all the way up to the mid transverse colon to have excellent  mobilization of the colon.   Attention was turned down to the pelvis.  The rectosigmoid was elevated  anteriorly, and I was able to begin total mesorectal incision, starting  posteriorly.  Care was made to preserve the posterior nerve plexus at  the sacral promontory.  Careful dissection was done to free posteriorly  all the way down to the more distal sacral rectosacral attachments.  Further dissection was done to free off the lateral wall stalks from the  right and left side.  Dr. Donell Beers was able to help me with her smaller  hands to help push things out of the way and also provide laparoscopic  traction as well.  Eventually, we got a good posterior and lateral  mobilization.  I was able to score the peritoneal reflection, which was  quite distal, and also at the level of the prostate and free that off.  I was able to get good mobilization as well.  I could see the [place] ink  staining.  With careful further dissection, I was able to get all the  way nearly to the levators.   I ended up placing my left hand through the anus into the proximal  rectum and my right hand was sterilely in the pelvis; therefore, I could  feel that we were at least a couple of centimeters distal to the lesion  and had been freed off well.  Total mesorectal excision was confirmed.   I used a contour stapler x2 firings to transect in the distal rectum.  I  was able to eviscerate the colon and palpate the mass within the  specimen.  I could digitally palpate the short rectal stump, which left  only really about 3 cm of rectal stump proximal to the sphincter.  Certainly, there was no tumor left in the rectal stump.  We went ahead  and opened it up on the back table.  The mass actually seemed closer to  the distal end than we had  initially expected.  Dr. Donell Beers shaved off a  distal margin, and frozen section was negative.  We tried to make an  attempt to staple off another centimeter of stump, but he had a very  fat, narrow, deep and horizontal and vertical pelvis that allowed  visualization extremely difficult and the torquing to do this was  putting a lot of tension on the rectal stump, and I was worried it was  causing more damage than help.  Therefore, I elected to not do any  further resection of the rectal stump since this was only a few  centimeters from the sphincters anyway.   An area at the junction between the descending and sigmoid colon was  clamped off and transected.  The mesentery was taken down in an array-  like fashion, including the mesentery artery and vein high ligation to  good result.  A couple of larger vessels were ligated using 2-0 silk  ties on the remaining descending colon mesentery.   The sizes were chosen, and a 33 easily fit; therefore, we placed a 33  stapler on the open end of the descending colon, which reached well down  into the pelvis using a 0 Prolene purse string stitch.  Dr. Donell Beers went  down the pelvis and was able to gently dilate the anus and get the  stapler up.  The spike was brought out the short rectal stump.  I  attached the anvil.  I elevated the seminal vesicles and prostate and  other organs out of the way with my hands.  I was able to allow  the  anvil to screw down onto the stapler.  The stapler was held clamped down  for a minute, fired, held for 30 seconds, and released.  We had two  excellent anastomotic rings.  The distal ring was at least 1 cm in  length.  We felt like we had adequate distal margin.  Specimens were  sent off.   Copious irrigation was done, and hemostasis assured in the pelvis as  well as the abdomen.  We placed a Blake down into the pelvis until  slightly bloody.  I chose distal ileum with a longer mesentery that  would fit well.  The  patient had been marked preoperatively in the right  mid abdomen; therefore, that skin was opened up to about a 3 cm circular  defect.  We came down to the fascia and split in a cruciate fashion.  I  was able to bring up a loop of ileum very easily.  I could tell through  the hand port that it was oriented appropriately with the distal end  inferiorly and the proximal end superior.  I broke up some Seprafilm  patches and placed that down in the abdominal wall tunnel of the loop  ileostomy to make take-down a little easier postoperatively.   Drain was secured  at the skin, ports were removed.  Small port sites  were closed using 5-0 Monocryl stitch.  The periumbilical incision was  closed using a 0 Vicryl figure-of-eight stitch to good result.  The  Pfannenstiel incision was closed using 0 Vicryl in the posterior rectus  fascia, and the peritoneum was closed using 0 Vicryl stitch.  The  anterior rectus fascia was closed transversely using a #1 PDS loop  stitch.  Skin was loosely closed using 4-0 Monocryl stitch with some  Telfa packings.  Please note that 500 ml of irrigation was done with  each layer of closure on the abdominal wound.  Also, an OnCue pain pump  dual chamber was passed with the catheter passed into the preperitoneal  plane along the lower abdomen, especially  around the incision to good  result.   The ileostomy was secured using 3-0 Vicryl interrupted stitches to good  result, and sterile dressings applied.  Patient was extubated and taken  to the recovery room in stable condition.   The patient's family had been updated on a nearly hourly basis.  I am  about to discuss the postoperative findings with them as well.      Ardeth Sportsman, MD  Electronically Signed     SCG/MEDQ  D:  10/22/2007  T:  10/22/2007  Job:  [id number]   cc:   Barbette Hair. Arlyce Dice, MD,FACG  [address]  [place]  [place] [address]   Leighton Roach Truett Perna, M.D.  Fax: [phone]   Durene Romans. Lestine Box,  MD  Fax: [phone]

## 2010-06-26 NOTE — Discharge Summary (Signed)
NAME:  Adam Wilson, Adam Wilson NO.:  [id number]   MEDICAL RECORD NO.:  [id number]          PATIENT TYPE:  INP   LOCATION:  1536                         FACILITY:  [facility]   PHYSICIAN:  Ardeth Sportsman, MD     DATE OF BIRTH:  January 03, 1966   DATE OF ADMISSION:  10/22/2007  DATE OF DISCHARGE:  10/25/2007                               DISCHARGE SUMMARY   PRIMARY CARE PHYSICIAN:  Not available.   GASTROENTEROLOGIST:  Barbette Hair. Arlyce Dice, MD,FACG.   MEDICAL ONCOLOGIST:  Leighton Roach. Truett Perna, M.D.   RADIATION ONCOLOGIST:  Durene Romans. Lestine Box, M.D.   FINAL DISCHARGE DIAGNOSES:  1. Low rectal cancer, uT3, uN1.  2. Umbilical hernia.   OTHER DIAGNOSIS:  Ileal polyposis.   PROCEDURE PERFORMED:  Laparoscopic mobilization of splenic flexure of  the colon and low anterior resection with a 33 EAA low staple  anastomosis, diverting loop ileostomy and primary umbilical hernia  repair on October 22, 2007.   SUMMARY OF HOSPITAL COURSE:  [name] is a pleasant 45 year old  morbidly obese male with a mid-to-low rectal cancer that is uT3, uN1 via  transrectal ultrasound.  He underwent perioperative chemoradiation  therapy, followed by Drs. Truett Perna and [employer].  He seemed to have a  good clinical response.  He underwent low anterior resection.  Because  of his prior radiation and a low anastomosis, I went ahead and diverted  him with a loop ileostomy.   He was placed on the Entereg protocol.  He was advanced from liquids to  low residue diet and was tolerating this at the time of discharge.  His  pain was controlled with oral medications.  His On-Q pain pump finished,  and it was removed.  At the time of discharge, he was walking the  hallway and is comfortable on pain control pills, having good output  from his ileostomy.   DISCHARGE INSTRUCTIONS:  Based on his improvements, we thought it would  be reasonable to discharge home with the following instructions:  1. He is to return to clinic  to see me in about 10-14 days for      followup.  2. He should drink plenty of liquids to avoid dehydration within the      ostomy and use Imodium over the counter to help avoid severe      dehydration.  3. He should call if he has fever, chills, sweats, nausea, vomiting,      worsening pain or drainage.  4. He should use an ice pack or heating pad for pain control.  He can      use ibuprofen 400-800 mg p.o. q.6h. p.r.n. pain.  He can also use      oxycodone 5-10 mg q.4h. p.r.n. pain.  5. He should resume his normal home medications.      Ardeth Sportsman, MD  Electronically Signed     SCG/MEDQ  D:  10/26/2007  T:  10/26/2007  Job:  [id number]   cc:   Barbette Hair. Arlyce Dice, MD,FACG  [address]  [place]  [place] [address]   Durene Romans. Lestine Box, MD  Fax: [phone]   Leighton Roach Truett Perna, M.D.  Fax: [phone]

## 2010-07-16 ENCOUNTER — Encounter (HOSPITAL_BASED_OUTPATIENT_CLINIC_OR_DEPARTMENT_OTHER): Payer: 59 | Admitting: Oncology

## 2010-07-16 ENCOUNTER — Encounter (HOSPITAL_COMMUNITY): Payer: Self-pay

## 2010-07-16 ENCOUNTER — Other Ambulatory Visit: Payer: Self-pay | Admitting: Oncology

## 2010-07-16 ENCOUNTER — Ambulatory Visit (HOSPITAL_COMMUNITY)
Admission: RE | Admit: 2010-07-16 | Discharge: 2010-07-16 | Disposition: A | Discharge status: Home / Self Care | Payer: 59 | Source: Ambulatory Visit | Attending: Oncology | Admitting: Oncology

## 2010-07-16 DIAGNOSIS — M87059 Idiopathic aseptic necrosis of unspecified femur: Secondary | ICD-10-CM | POA: Insufficient documentation

## 2010-07-16 DIAGNOSIS — C20 Malignant neoplasm of rectum: Secondary | ICD-10-CM

## 2010-07-16 DIAGNOSIS — N200 Calculus of kidney: Secondary | ICD-10-CM | POA: Insufficient documentation

## 2010-07-16 DIAGNOSIS — K7689 Other specified diseases of liver: Secondary | ICD-10-CM | POA: Insufficient documentation

## 2010-07-16 DIAGNOSIS — K8020 Calculus of gallbladder without cholecystitis without obstruction: Secondary | ICD-10-CM | POA: Insufficient documentation

## 2010-07-16 LAB — BASIC METABOLIC PANEL - CANCER CENTER ONLY
CO2: 29 mEq/L (ref 18–33)
Calcium: 8.9 mg/dL (ref 8.0–10.3)
Glucose, Bld: 148 mg/dL — ABNORMAL HIGH (ref 73–118)
Potassium: 3.8 mEq/L (ref 3.3–4.7)
Sodium: 138 mEq/L (ref 128–145)

## 2010-07-16 LAB — CBC WITH DIFFERENTIAL/PLATELET
Eosinophils Absolute: 0.2 10*3/uL (ref 0.0–0.5)
LYMPH%: 22.8 % (ref 14.0–49.0)
MONO#: 0.4 10*3/uL (ref 0.1–0.9)
NEUT#: 2.7 10*3/uL (ref 1.5–6.5)
Platelets: 225 10*3/uL (ref 140–400)
RBC: 4.1 10*6/uL — ABNORMAL LOW (ref 4.20–5.82)
WBC: 4.3 10*3/uL (ref 4.0–10.3)

## 2010-07-16 LAB — HEMOGLOBIN A1C: Mean Plasma Glucose: 154 mg/dL — ABNORMAL HIGH (ref <117)

## 2010-07-16 MED ORDER — IOHEXOL 300 MG/ML  SOLN
125.0000 mL | Freq: Once | INTRAMUSCULAR | Status: AC | PRN
  Administered 2010-07-16: 125 mL via INTRAVENOUS

## 2010-07-26 ENCOUNTER — Encounter (HOSPITAL_BASED_OUTPATIENT_CLINIC_OR_DEPARTMENT_OTHER): Payer: 59 | Admitting: Oncology

## 2010-07-26 ENCOUNTER — Other Ambulatory Visit: Payer: Self-pay | Admitting: Oncology

## 2010-07-26 DIAGNOSIS — G620 Drug-induced polyneuropathy: Secondary | ICD-10-CM

## 2010-07-26 DIAGNOSIS — R209 Unspecified disturbances of skin sensation: Secondary | ICD-10-CM

## 2010-07-26 DIAGNOSIS — C19 Malignant neoplasm of rectosigmoid junction: Secondary | ICD-10-CM

## 2010-07-26 DIAGNOSIS — C20 Malignant neoplasm of rectum: Secondary | ICD-10-CM

## 2010-08-21 ENCOUNTER — Encounter: Payer: Self-pay | Admitting: Internal Medicine

## 2010-08-27 ENCOUNTER — Other Ambulatory Visit (INDEPENDENT_AMBULATORY_CARE_PROVIDER_SITE_OTHER): Payer: 59

## 2010-08-27 ENCOUNTER — Other Ambulatory Visit: Payer: Self-pay | Admitting: Internal Medicine

## 2010-08-27 ENCOUNTER — Encounter: Payer: Self-pay | Admitting: Internal Medicine

## 2010-08-27 ENCOUNTER — Ambulatory Visit (INDEPENDENT_AMBULATORY_CARE_PROVIDER_SITE_OTHER): Payer: 59 | Admitting: Internal Medicine

## 2010-08-27 VITALS — BP 110/68 | HR 54 | Temp 97.1°F | Resp 16 | Ht 73.0 in | Wt 296.5 lb

## 2010-08-27 DIAGNOSIS — G609 Hereditary and idiopathic neuropathy, unspecified: Secondary | ICD-10-CM

## 2010-08-27 DIAGNOSIS — D649 Anemia, unspecified: Secondary | ICD-10-CM

## 2010-08-27 DIAGNOSIS — N529 Male erectile dysfunction, unspecified: Secondary | ICD-10-CM

## 2010-08-27 DIAGNOSIS — IMO0001 Reserved for inherently not codable concepts without codable children: Secondary | ICD-10-CM

## 2010-08-27 DIAGNOSIS — E118 Type 2 diabetes mellitus with unspecified complications: Secondary | ICD-10-CM | POA: Insufficient documentation

## 2010-08-27 DIAGNOSIS — G629 Polyneuropathy, unspecified: Secondary | ICD-10-CM

## 2010-08-27 LAB — URINALYSIS, ROUTINE W REFLEX MICROSCOPIC
Bilirubin Urine: NEGATIVE
Leukocytes, UA: NEGATIVE
Nitrite: NEGATIVE
Total Protein, Urine: NEGATIVE
Urobilinogen, UA: 0.2 (ref 0.0–1.0)

## 2010-08-27 LAB — CBC WITH DIFFERENTIAL/PLATELET
Basophils Relative: 0.4 % (ref 0.0–3.0)
Eosinophils Absolute: 0.4 10*3/uL (ref 0.0–0.7)
Eosinophils Relative: 9.8 % — ABNORMAL HIGH (ref 0.0–5.0)
Hemoglobin: 12.1 g/dL — ABNORMAL LOW (ref 13.0–17.0)
Lymphocytes Relative: 21 % (ref 12.0–46.0)
MCHC: 33.1 g/dL (ref 30.0–36.0)
MCV: 92.3 fl (ref 78.0–100.0)
Monocytes Absolute: 0.3 10*3/uL (ref 0.1–1.0)
Neutro Abs: 2.3 10*3/uL (ref 1.4–7.7)
RBC: 3.97 Mil/uL — ABNORMAL LOW (ref 4.22–5.81)
WBC: 3.8 10*3/uL — ABNORMAL LOW (ref 4.5–10.5)

## 2010-08-27 LAB — IBC PANEL: Saturation Ratios: 7 % — ABNORMAL LOW (ref 20.0–50.0)

## 2010-08-27 LAB — COMPREHENSIVE METABOLIC PANEL
AST: 32 U/L (ref 0–37)
Albumin: 4.6 g/dL (ref 3.5–5.2)
BUN: 12 mg/dL (ref 6–23)
Calcium: 8.8 mg/dL (ref 8.4–10.5)
Chloride: 104 mEq/L (ref 96–112)
Creatinine, Ser: 0.7 mg/dL (ref 0.4–1.5)
Glucose, Bld: 114 mg/dL — ABNORMAL HIGH (ref 70–99)
Potassium: 3.8 mEq/L (ref 3.5–5.1)

## 2010-08-27 LAB — LDL CHOLESTEROL, DIRECT: Direct LDL: 169.4 mg/dL

## 2010-08-27 LAB — TSH: TSH: 13.87 u[IU]/mL — ABNORMAL HIGH (ref 0.35–5.50)

## 2010-08-27 MED ORDER — CYANOCOBALAMIN 1000 MCG/ML IJ SOLN
1000.0000 ug | Freq: Once | INTRAMUSCULAR | Status: AC
  Administered 2010-08-27: 1000 ug via INTRAMUSCULAR

## 2010-08-27 MED ORDER — SILDENAFIL CITRATE 50 MG PO TABS: 50.0000 mg | ORAL_TABLET | ORAL | Status: DC | PRN

## 2010-08-27 NOTE — Progress Notes (Signed)
Subjective:    Patient ID: Adam Wilson, male    DOB: 17-Sep-1965, 45 y.o.   MRN: [id number]  Diabetes He presents for his initial diabetic visit. He has type 2 diabetes mellitus. The initial diagnosis of diabetes was made 3 months ago. His disease course has been stable. There are no hypoglycemic associated symptoms. Pertinent negatives for hypoglycemia include no confusion, dizziness, headaches, nervousness/anxiousness, pallor, seizures, speech difficulty or tremors. Associated symptoms include foot paresthesias, polydipsia, polyphagia, polyuria and weakness. Pertinent negatives for diabetes include no blurred vision, no chest pain, no fatigue, no foot ulcerations, no visual change and no weight loss. There are no hypoglycemic complications. Symptoms are stable. Symptoms have been present for 1 year. 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 Pertinent negatives for diabetic complications include no autonomic neuropathy, CVA, heart disease, nephropathy, PVD or retinopathy. Current diabetic treatment includes diet. He is compliant with treatment some of the time. His weight is stable. He is following a generally healthy diet. Meal planning includes avoidance of concentrated sweets. There is no change in his home blood glucose trend. His breakfast blood glucose range is generally 110-130 mg/dl. His lunch blood glucose range is generally 130-140 mg/dl. His dinner blood glucose range is generally 130-140 mg/dl. His highest blood glucose is 130-140 mg/dl. His overall blood glucose range is 130-140 mg/dl. An ACE inhibitor/angiotensin II receptor blocker is not being taken. He does not see a podiatrist.Eye exam is not current.  Erectile Dysfunction This is a new problem. The current episode started more than 1 month ago. The problem is unchanged. The nature of his difficulty is achieving erection, maintaining erection and penetration. Non-physiologic factors contributing to erectile  dysfunction are a decreased libido. He reports no anxiety or performance anxiety. He reports his erection duration to be 1 to 5 minutes. Irritative symptoms include frequency. Irritative symptoms do not include urgency. Obstructive symptoms do not include dribbling, incomplete emptying, an intermittent stream, a slower stream, straining or a weak stream. Pertinent negatives include no chills, dysuria, genital pain, hematuria, hesitancy or inability to urinate. The symptoms are aggravated by medications and stress. Past treatments include nothing. Risk factors include diabetes mellitus, colon surgery and nerve injury.  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 It has caused numbness and weakness symmetrically from the hips down.    Review of Systems  Constitutional: Negative for fever, chills, weight loss, diaphoresis, activity change, appetite change, fatigue and unexpected weight change.  HENT: Negative for sore throat, facial swelling, trouble swallowing, neck pain, neck stiffness and voice change.   Eyes: Negative for blurred vision, photophobia, redness and visual disturbance.  Respiratory: Negative for apnea, cough, choking, chest tightness, shortness of breath, wheezing and stridor.   Cardiovascular: Negative for chest pain, palpitations and leg swelling.  Gastrointestinal: Positive for diarrhea. Negative for nausea, vomiting, abdominal pain, constipation, blood in stool and anal bleeding.  Genitourinary: Positive for polyuria, frequency, impotence and decreased libido. Negative for dysuria, hesitancy, urgency, hematuria, flank pain, decreased urine volume, discharge, penile swelling, scrotal swelling, enuresis, difficulty urinating, genital sores, penile pain, testicular pain and incomplete emptying.  Musculoskeletal: Negative for myalgias, back pain, joint swelling, arthralgias and gait problem.  Skin: Negative  for color change, pallor, rash and wound.  Neurological: Positive for weakness and numbness. Negative for dizziness, tremors, seizures, syncope, facial asymmetry, speech difficulty, light-headedness and headaches.  Hematological: Positive for polydipsia and polyphagia. Negative for adenopathy.  Does not bruise/bleed easily.  Psychiatric/Behavioral: Negative for suicidal ideas, hallucinations, behavioral problems, confusion, sleep disturbance, self-injury, dysphoric mood, decreased concentration and agitation. The patient is not nervous/anxious and is not hyperactive.        Objective:   Physical Exam  Vitals reviewed. Constitutional: He appears well-developed and well-nourished. No distress.  HENT:  Head: Normocephalic and atraumatic.  Right Ear: External ear normal.  Left Ear: External ear normal.  Nose: Nose normal.  Mouth/Throat: Oropharynx is clear and moist. No oropharyngeal exudate.  Eyes: Conjunctivae and EOM are normal. Pupils are equal, round, and reactive to light. Right eye exhibits no discharge. Left eye exhibits no discharge. No scleral icterus.  Neck: Normal range of motion. Neck supple. No JVD present. No tracheal deviation present. No thyromegaly present.  Cardiovascular: Normal rate, regular rhythm, normal heart sounds and intact distal pulses.  Exam reveals no gallop and no friction rub.   No murmur heard. Pulmonary/Chest: Effort normal and breath sounds normal. No stridor. No respiratory distress. He has no wheezes. He has no rales. He exhibits no tenderness.  Abdominal: Soft. Bowel sounds are normal. He exhibits no distension and no mass. There is no tenderness. There is no rebound and no guarding.  Musculoskeletal: Normal range of motion. He exhibits no edema and no tenderness.  Lymphadenopathy:    He has no cervical adenopathy.  Neurological: He is alert. He has normal strength. He displays no atrophy and no tremor. A sensory deficit (both legs) is present. No cranial  nerve deficit. He exhibits normal muscle tone. He displays a negative Romberg sign. He displays no seizure activity. Coordination and gait normal. He displays no Babinski's sign on the right side. He displays no Babinski's sign on the left side.  Reflex Scores:      Tricep reflexes are 1+ on the right side and 1+ on the left side.      Bicep reflexes are 1+ on the right side and 1+ on the left side.      Brachioradialis reflexes are 1+ on the right side and 1+ on the left side.      Patellar reflexes are 0 on the right side and 0 on the left side.      Achilles reflexes are 0 on the right side and 0 on the left side. Skin: Skin is warm and dry. No rash noted. He is not diaphoretic. No erythema. No pallor.  Psychiatric: He has a normal mood and affect. His behavior is normal. Judgment and thought content normal.        Lab Results  Component Value Date   WBC 2.9* 04/18/2008   HGB 12.7* 07/16/2010   HCT 38.1* 07/16/2010   PLT 225 07/16/2010   ALT 21 06/19/2009   AST 22 06/19/2009   NA 138 07/16/2010   K 3.8 07/16/2010   CL 98 07/16/2010   CREATININE 0.7 07/16/2010   BUN 9 07/16/2010   CO2 29 07/16/2010   HGBA1C 7.0* 07/16/2010    Assessment & Plan:

## 2010-08-27 NOTE — Patient Instructions (Addendum)
Diabetes, Type 2 Diabetes is a lasting (chronic) disease. In type 2 diabetes, the pancreas does not make enough insulin (a hormone), and the body does not respond normally to the insulin that is made. This type of diabetes was also previously called adult onset diabetes. About 90% of all those who have diabetes have type 2. It usually occurs after the age of 27 but can occur at any age. CAUSES Unlike type 1 diabetes, which happens because insulin is no longer being made, type 2 diabetes happens because the body is making less insulin and has trouble using the insulin properly. SYMPTOMS  Drinking more than usual.   Urinating more than usual.   Blurred vision.   Dry, itchy skin.   Frequent infection like yeast infections in women.   More tired than usual (fatigue).  TREATMENT  Healthy eating.   Exercise.   Medication, if needed.   Monitoring blood glucose (sugar).   Seeing your caregiver regularly.  HOME CARE INSTRUCTIONS  Check your blood glucose (sugar) at least once daily. More frequent monitoring may be necessary, depending on your medications and on how well your diabetes is controlled. Your caregiver will advise you.   Take your medicine as directed by your caregiver.   Do not smoke.   Make wise food choices. Ask your caregiver for information. Weight loss can improve your diabetes.   Learn about low blood glucose (hypoglycemia) and how to treat it.   Get your eyes checked regularly.   Have a yearly physical exam. Have your blood pressure checked. Get your blood and urine tested.   Wear a pendant or bracelet saying that you have diabetes.   Check your feet every night for sores. Let your caregiver know if you have sores that are not healing.  SEEK MEDICAL CARE IF:  You are having problems keeping your blood glucose at target range.   You feel you might be having problems with your medicines.   You have symptoms of an illness that is not improving after 24  hours.   You have a sore or wound that is not healing.   You notice a change in vision or a new problem with your vision.   You develop a fever of more than 100.5.  Document Released: 01/28/2005 Document Re-Released: 02/19/2009 Exit[employer] Patient Information 2011 [employer].2400 Calorie Diabetic Diet The 2400 calorie diabetic diet is designed for eating up to 2400 calories each day. Following this diet and making healthy meal choices can help improve overall health. It controls blood sugar levels, and can also lower blood pressure and cholesterol.  SERVING SIZES: Measuring foods and serving sizes helps to make sure you are getting the right amount of food. The list below tells how big or small some common serving sizes are.  1 ounce (oz.)................4 stacked dice.   3 oz.............................Marland KitchenDeck of cards.   1 teaspoon (tsp) .........Marland KitchenTip of little finger.   1 tablespoon (Tbsp)...Marland KitchenMarland KitchenThumb.   2 Tbsp.........................Marland KitchenGolf ball.    Cup.........................Marland KitchenHalf of a fist.   1 Cup..........................Marland KitchenA fist.  GUIDELINES FOR CHOOSING FOODS: The goal of this diet is to eat a variety of foods and limit calories to 2400 each day. This can be done by choosing foods that are low in calories and in fat. The diet also suggests eating small amounts of food often. Doing this helps control your blood sugar levels so they do not get too high or low. Each meal or snack should contain a protein food source to help you feel  more satisfied and to stabilize your blood sugar. Try to eat about the same amount of food around the same time each day. This includes weekend days, travel days and days off work. Space your meals about 4-5 hours apart and add a snack between them if you wish.  For example, a daily food plan could include breakfast, a morning snack, lunch, dinner and an evening snack. Healthy meals and snacks have a variety of foods including whole grains, vegetables,  fruits, lean meats, poultry, fish and dairy products. As you plan your meals, select a variety of foods. Choose from the bread and starch, vegetable, fruit, dairy and meat/protein groups. Examples of foods from each group are listed below with their suggested serving sizes. Use measuring cups and spoons to become familiar with what a healthy portion looks like.  Bread and Starch (each serving equals 15 grams of carbohydrate)  1 slice bread.   1/4 bagel.    cup cold cereal (unsweetened).    cup hot cereal, cooked pasta or mashed potatoes.   1 small potato (size of a computer mouse).   1/3 cup cooked pasta or rice.    English muffin.   1 cup broth based soup.   3 cups of popcorn.   4-6 whole wheat crackers.    cup cooked beans, peas or corn.   Vegetable (each serving equals 5 grams of carbohydrate)   cup cooked vegetables.   1 cup raw vegetables   1/2 cup tomato juice.    Fruit (each serving equals 15 grams of carbohydrate)   1 small apple or orange.   1 cup watermelon or strawberries.    cup applesauce (no sugar added).   2 Tbsp raisins.    banana.    cup unsweetened canned fruit.    cup unsweetened fruit juice.   Dairy (each serving equals 12-15 grams of carbohydrate)  1 cup fat free milk.   6 oz artificially sweetened yogurt.   1 cup buttermilk   1 cup soy milk    Meat/Protein  1 large egg.   2-3 oz meat, poultry or fish.    cup cottage cheese.   1 Tbsp peanut butter.    cup tofu.   1 oz cheese.    cup canned tuna in water.   SAMPLE 2400 CALORIE DIET PLAN: Breakfast:  1 english muffin (2 carb servings).  1 scrambled egg.   2 tsp margarine.  Fat free milk, 1 cup (1 carb serving).   1 large orange (2 carb servings).   Morning Snack:  Low fat cottage cheese, 1/4 cup.   cup canned peaches in juice (1 carb serving).   Carrot sticks, 1 cup.  5 whole wheat melba toasts (1 carb serving).   Lunch:  Grilled chicken  salad.  2 oz chicken breast.   Romaine lettuce or spinach, 1 cup.   Diced tomato,  cup.   Shredded carrots,  cup.   Sliced cucumbers,  cup.   Low fat salad dressing, 2 Tbsp.   Whole wheat bread, 2 slices (2 carb servings).  1 small apple (1 carb serving).   Fat free milk, 1 cup (1 carb serving).   15 baked chips ( 1 carb serving).   Afternoon Snack:  8 reduced fat triscuits (2 carb servings).  Peanut butter, 2 Tbsp.   Dinner:  3 oz salmon, broiled.  4.5 small red potatoes, roasted with 1 tsp olive oil and seasoning (3 carb servings).   Green beans, 1 cup.  Strawberries, 1  cup (1 carb serving).   Fat free milk, 1 cup (1 carb serving).   Evening Snack:  6 cups air popped popcorn (2 carb servings).  2 Tbsp parmesan cheese sprinkled on top.   8 almonds.   Meal Plan You can use this worksheet to help you make a daily meal plan based on the 2400 calorie diabetic diet suggestions. If you are using this plan to help you control your blood glucose, you may interchange carbohydrate containing foods (dairy, starches, and fruits). Select a variety of fresh foods of varying colors and flavors. The total amount of carbohydrate in your meal or snack is more important than making sure you include all of the food groups every time you eat. Remember that you should choose the following foods for a day's meals:  13 Starches.  4 Vegetables.   4 Fruits.   3 Dairy.   8 oz Meat.   Up to 8 Fats.   Your dietician can use this worksheet to help you decide how many servings and what types of foods are right for you. Breakfast Food Group and Servings Food Choice Starches ________________________________________________________ Dairy __________________________________________________________ Fruit ___________________________________________________________ Meat  ___________________________________________________________ Fat_________________________________________________________ Serina Cowper Food Group and Servings Food Choice  Starches _________________________________________________________ Meat __________________________________________________________ Vegetables _____________________________________________________ Fruit __________________________________________________________ Dairy _________________________________________________________ Fat________________________________________________________ Afternoon Snack Food Group and Servings Food Choice Starch ____________________________________________________ Meat_____________________________________________________ Laural Golden Food Group and Servings Food Choice Starches _______________________________________________________ Meat __________________________________________________________ Dairy _________________________________________________________ Vegetable ______________________________________________________ Fruit __________________________________________________________ Fat_______________________________________________________ Evening Snack Food Group and Servings Food Choice Fruit __________________________________________________________ Meat __________________________________________________________ Starches _______________________________________________________ Daily Totals Starches _________________________ Vegetables _________________________ Fruits _____________________________ Dairy _____________________________ Meat ______________________________ Fats _______________________________  Document Released: 08/20/2004 Document Re-Released: 07/18/2009 Exit[employer] Patient Information 2011 [place], [place].

## 2010-08-27 NOTE — Assessment & Plan Note (Signed)
I offered him viagra to help with this

## 2010-08-27 NOTE — Assessment & Plan Note (Signed)
Labs ordered today to obtain more info about this

## 2010-08-27 NOTE — Assessment & Plan Note (Signed)
I will  Recheck his A1C today, I gave him pt ed material about DM II and 2400 cal ADA diet

## 2010-08-27 NOTE — Assessment & Plan Note (Signed)
I will check his labs today (B12, TSH, etc) and I have asked him to get a NCS done to obtain more info about this

## 2010-08-28 ENCOUNTER — Other Ambulatory Visit: Payer: Self-pay | Admitting: Internal Medicine

## 2010-08-28 ENCOUNTER — Encounter: Payer: Self-pay | Admitting: Internal Medicine

## 2010-08-28 DIAGNOSIS — E039 Hypothyroidism, unspecified: Secondary | ICD-10-CM | POA: Insufficient documentation

## 2010-08-28 MED ORDER — LEVOTHYROXINE SODIUM 100 MCG PO TABS: 100.0000 ug | ORAL_TABLET | Freq: Every day | ORAL | Status: DC

## 2010-08-31 ENCOUNTER — Telehealth: Payer: Self-pay | Admitting: *Deleted

## 2010-08-31 NOTE — Telephone Encounter (Signed)
See letter.

## 2010-08-31 NOTE — Telephone Encounter (Signed)
Returned call to patient and was advised wrong number. Letter was mailed out 08/28/10, closing phone note until pt calls

## 2010-08-31 NOTE — Telephone Encounter (Signed)
Requesting lab results

## 2010-09-01 ENCOUNTER — Emergency Department (HOSPITAL_COMMUNITY): Payer: 59

## 2010-09-01 ENCOUNTER — Emergency Department (HOSPITAL_COMMUNITY)
Admission: EM | Admit: 2010-09-01 | Discharge: 2010-09-01 | Disposition: A | Discharge status: Home / Self Care | Payer: 59 | Attending: Emergency Medicine | Admitting: Emergency Medicine

## 2010-09-01 DIAGNOSIS — N23 Unspecified renal colic: Secondary | ICD-10-CM | POA: Insufficient documentation

## 2010-09-01 DIAGNOSIS — R109 Unspecified abdominal pain: Secondary | ICD-10-CM | POA: Insufficient documentation

## 2010-09-01 DIAGNOSIS — R112 Nausea with vomiting, unspecified: Secondary | ICD-10-CM | POA: Insufficient documentation

## 2010-09-01 LAB — DIFFERENTIAL
Basophils Absolute: 0 10*3/uL (ref 0.0–0.1)
Lymphocytes Relative: 11 % — ABNORMAL LOW (ref 12–46)
Lymphs Abs: 0.7 10*3/uL (ref 0.7–4.0)
Neutro Abs: 4.7 10*3/uL (ref 1.7–7.7)
Neutrophils Relative %: 76 % (ref 43–77)

## 2010-09-01 LAB — URINALYSIS, ROUTINE W REFLEX MICROSCOPIC
Glucose, UA: NEGATIVE mg/dL
Ketones, ur: NEGATIVE mg/dL
Protein, ur: 30 mg/dL — AB

## 2010-09-01 LAB — CBC
HCT: 35.2 % — ABNORMAL LOW (ref 39.0–52.0)
Hemoglobin: 11.7 g/dL — ABNORMAL LOW (ref 13.0–17.0)
MCV: 88.2 fL (ref 78.0–100.0)
RBC: 3.99 MIL/uL — ABNORMAL LOW (ref 4.22–5.81)
RDW: 15.7 % — ABNORMAL HIGH (ref 11.5–15.5)
WBC: 6.2 10*3/uL (ref 4.0–10.5)

## 2010-09-01 LAB — COMPREHENSIVE METABOLIC PANEL
AST: 33 U/L (ref 0–37)
CO2: 23 mEq/L (ref 19–32)
Chloride: 104 mEq/L (ref 96–112)
Creatinine, Ser: 1.04 mg/dL (ref 0.50–1.35)
GFR calc Af Amer: >60 mL/min (ref >60)
GFR calc non Af Amer: >60 mL/min (ref >60)
Glucose, Bld: 137 mg/dL — ABNORMAL HIGH (ref 70–99)
Total Bilirubin: 0.4 mg/dL (ref 0.3–1.2)

## 2010-09-10 ENCOUNTER — Ambulatory Visit (INDEPENDENT_AMBULATORY_CARE_PROVIDER_SITE_OTHER): Payer: 59 | Admitting: *Deleted

## 2010-09-10 DIAGNOSIS — D518 Other vitamin B12 deficiency anemias: Secondary | ICD-10-CM

## 2010-09-10 DIAGNOSIS — D519 Vitamin B12 deficiency anemia, unspecified: Secondary | ICD-10-CM

## 2010-09-10 MED ORDER — CYANOCOBALAMIN 1000 MCG/ML IJ SOLN
1000.0000 ug | Freq: Once | INTRAMUSCULAR | Status: AC
  Administered 2010-09-10: 1000 ug via INTRAMUSCULAR

## 2010-09-26 ENCOUNTER — Ambulatory Visit (INDEPENDENT_AMBULATORY_CARE_PROVIDER_SITE_OTHER): Payer: 59 | Admitting: *Deleted

## 2010-09-26 DIAGNOSIS — D519 Vitamin B12 deficiency anemia, unspecified: Secondary | ICD-10-CM

## 2010-09-26 DIAGNOSIS — D518 Other vitamin B12 deficiency anemias: Secondary | ICD-10-CM

## 2010-09-26 MED ORDER — CYANOCOBALAMIN 1000 MCG/ML IJ SOLN
1000.0000 ug | Freq: Once | INTRAMUSCULAR | Status: AC
  Administered 2010-09-26: 1000 ug via INTRAMUSCULAR

## 2010-09-26 MED ORDER — "SYRINGE/NEEDLE (DISP) 25G X 5/8"" 3 ML MISC": Status: DC

## 2010-09-26 MED ORDER — CYANOCOBALAMIN 1000 MCG/ML IJ SOLN: 1000.0000 ug | INTRAMUSCULAR | Status: DC

## 2010-09-26 NOTE — Progress Notes (Signed)
Patient and wife were in office today for teaching nurse visit. Explained correct technique for preparation and subq self injection of b-12. Also explained to wife who correctly demonstrated injection of 1000 mcg of B-12 in back of pt's left arm. 20 minutes spent with patient and wife.

## 2010-09-26 NOTE — Progress Notes (Signed)
Addended by: Vernie Murders on: 09/26/2010 10:11 AM   Modules accepted: Orders

## 2010-09-27 ENCOUNTER — Ambulatory Visit: Payer: 59

## 2010-10-29 ENCOUNTER — Ambulatory Visit (INDEPENDENT_AMBULATORY_CARE_PROVIDER_SITE_OTHER): Payer: 59 | Admitting: Internal Medicine

## 2010-10-29 ENCOUNTER — Encounter: Payer: Self-pay | Admitting: Internal Medicine

## 2010-10-29 ENCOUNTER — Other Ambulatory Visit (INDEPENDENT_AMBULATORY_CARE_PROVIDER_SITE_OTHER): Payer: 59

## 2010-10-29 DIAGNOSIS — D649 Anemia, unspecified: Secondary | ICD-10-CM

## 2010-10-29 DIAGNOSIS — E876 Hypokalemia: Secondary | ICD-10-CM | POA: Insufficient documentation

## 2010-10-29 DIAGNOSIS — E039 Hypothyroidism, unspecified: Secondary | ICD-10-CM

## 2010-10-29 DIAGNOSIS — D509 Iron deficiency anemia, unspecified: Secondary | ICD-10-CM | POA: Insufficient documentation

## 2010-10-29 DIAGNOSIS — G609 Hereditary and idiopathic neuropathy, unspecified: Secondary | ICD-10-CM

## 2010-10-29 DIAGNOSIS — IMO0001 Reserved for inherently not codable concepts without codable children: Secondary | ICD-10-CM

## 2010-10-29 DIAGNOSIS — G629 Polyneuropathy, unspecified: Secondary | ICD-10-CM

## 2010-10-29 DIAGNOSIS — N529 Male erectile dysfunction, unspecified: Secondary | ICD-10-CM

## 2010-10-29 LAB — LIPID PANEL
Cholesterol: 190 mg/dL (ref 0–200)
HDL: 28.3 mg/dL — ABNORMAL LOW (ref >39.00)
LDL Cholesterol: 129 mg/dL — ABNORMAL HIGH (ref 0–99)
Triglycerides: 162 mg/dL — ABNORMAL HIGH (ref 0.0–149.0)
VLDL: 32.4 mg/dL (ref 0.0–40.0)

## 2010-10-29 LAB — CBC WITH DIFFERENTIAL/PLATELET
Basophils Absolute: 0 10*3/uL (ref 0.0–0.1)
Basophils Relative: 0.5 % (ref 0.0–3.0)
Eosinophils Absolute: 0.2 10*3/uL (ref 0.0–0.7)
MCHC: 32 g/dL (ref 30.0–36.0)
MCV: 81.4 fl (ref 78.0–100.0)
Monocytes Absolute: 0.4 10*3/uL (ref 0.1–1.0)
Neutrophils Relative %: 69.8 % (ref 43.0–77.0)
RBC: 4.38 Mil/uL (ref 4.22–5.81)
RDW: 16.7 % — ABNORMAL HIGH (ref 11.5–14.6)

## 2010-10-29 LAB — VITAMIN B12: Vitamin B-12: 85 pg/mL — ABNORMAL LOW (ref 211–911)

## 2010-10-29 LAB — COMPREHENSIVE METABOLIC PANEL
AST: 20 U/L (ref 0–37)
Alkaline Phosphatase: 68 U/L (ref 39–117)
BUN: 13 mg/dL (ref 6–23)
Glucose, Bld: 107 mg/dL — ABNORMAL HIGH (ref 70–99)
Sodium: 137 mEq/L (ref 135–145)
Total Bilirubin: 0.6 mg/dL (ref 0.3–1.2)

## 2010-10-29 LAB — TSH: TSH: 4.55 u[IU]/mL (ref 0.35–5.50)

## 2010-10-29 MED ORDER — FERROUS SULFATE 325 (65 FE) MG PO TABS: 325.0000 mg | ORAL_TABLET | Freq: Three times a day (TID) | ORAL | Status: DC

## 2010-10-29 MED ORDER — SILDENAFIL CITRATE 100 MG PO TABS: 100.0000 mg | ORAL_TABLET | ORAL | Status: DC | PRN

## 2010-10-29 NOTE — Assessment & Plan Note (Signed)
I will increase the dose of his viagra and will check his testosterone level today

## 2010-10-29 NOTE — Assessment & Plan Note (Signed)
I will recheck his CBC today 

## 2010-10-29 NOTE — Assessment & Plan Note (Signed)
I will check his A1C today 

## 2010-10-29 NOTE — Assessment & Plan Note (Signed)
TSH today 

## 2010-10-29 NOTE — Progress Notes (Signed)
Subjective:    Patient ID: Adam Wilson, male    DOB: [birth year], 45 y.o.   MRN: [id number]  Diabetes He presents for his follow-up diabetic visit. He has type 2 diabetes mellitus. His disease course has been stable. There are no hypoglycemic associated symptoms. Pertinent negatives for hypoglycemia include no confusion, dizziness, headaches, nervousness/anxiousness, pallor, seizures, speech difficulty or tremors. Pertinent negatives for diabetes include no blurred vision, no chest pain, no fatigue, no foot paresthesias, no foot ulcerations, no polydipsia, no polyphagia, no polyuria, no visual change, no weakness and no weight loss. There are no hypoglycemic complications. Symptoms are stable. There are no diabetic complications. Current diabetic treatment includes diet. He is compliant with treatment all of the time. His weight is stable. He is following a generally healthy diet. Meal planning includes avoidance of concentrated sweets. He never participates in exercise. There is no change in his home blood glucose trend. An ACE inhibitor/angiotensin II receptor blocker is not being taken.  Anemia Presents for follow-up visit. There has been no abdominal pain, anorexia, bruising/bleeding easily, confusion, fever, leg swelling, light-headedness, malaise/fatigue, pallor, palpitations, paresthesias, pica or weight loss. Signs of blood loss that are not present include hematemesis, hematochezia and melena. There are no compliance problems.  Compliance with medications is 76-100%.  Erectile Dysfunction This is a recurrent problem. The current episode started more than 1 year ago. The problem is unchanged. The nature of his difficulty is achieving erection, maintaining erection and penetration. Non-physiologic factors contributing to erectile dysfunction are a decreased libido. He reports no anxiety or performance anxiety. He reports his erection duration to be 1 to 5 minutes. Irritative symptoms do not include  frequency, nocturia or urgency. Obstructive symptoms do not include dribbling, incomplete emptying, an intermittent stream, a slower stream, straining or a weak stream. Pertinent negatives include no chills, dysuria, genital pain, hematuria, hesitancy or inability to urinate. The symptoms are aggravated by nothing. Past treatments include sildenafil. The treatment provided mild relief. He has been using treatment for 1 to 6 months. He has had no adverse reactions caused by medications. Risk factors include colon surgery and diabetes mellitus.      Review of Systems  Constitutional: Negative for fever, chills, weight loss, malaise/fatigue, diaphoresis, activity change, appetite change, fatigue and unexpected weight change.  HENT: Negative.   Eyes: Negative.  Negative for blurred vision.  Respiratory: Negative for apnea, cough, choking, chest tightness, shortness of breath, wheezing and stridor.   Cardiovascular: Negative for chest pain, palpitations and leg swelling.  Gastrointestinal: Negative for nausea, vomiting, abdominal pain, diarrhea, constipation, melena, hematochezia, abdominal distention, anal bleeding, anorexia and hematemesis.  Genitourinary: Positive for decreased libido. Negative for dysuria, hesitancy, urgency, polyuria, frequency, hematuria, flank pain, decreased urine volume, discharge, penile swelling, scrotal swelling, enuresis, difficulty urinating, genital sores, penile pain, testicular pain, incomplete emptying and nocturia.  Musculoskeletal: Negative for myalgias, back pain, joint swelling, arthralgias and gait problem.  Skin: Negative for color change, pallor, rash and wound.  Neurological: Negative for dizziness, tremors, seizures, syncope, facial asymmetry, speech difficulty, weakness, light-headedness, numbness, headaches and paresthesias.  Hematological: Negative for polydipsia, polyphagia and adenopathy. Does not bruise/bleed easily.  Psychiatric/Behavioral: Negative for  suicidal ideas, hallucinations, behavioral problems, confusion, sleep disturbance, self-injury, dysphoric mood, decreased concentration and agitation. The patient is not nervous/anxious and is not hyperactive.        Objective:   Physical Exam  Vitals reviewed. Constitutional: He is oriented to person, place, and time. He appears well-developed and well-nourished. No distress.  HENT:  Mouth/Throat: Oropharynx is clear and moist. No oropharyngeal exudate.  Eyes: Conjunctivae are normal. Right eye exhibits no discharge. Left eye exhibits no discharge. No scleral icterus.  Neck: Normal range of motion. Neck supple. No JVD present. No tracheal deviation present. No thyromegaly present.  Cardiovascular: Normal rate, regular rhythm, normal heart sounds and intact distal pulses.  Exam reveals no gallop and no friction rub.   No murmur heard. Pulmonary/Chest: Effort normal and breath sounds normal. No stridor. No respiratory distress. He has no wheezes. He has no rales. He exhibits no tenderness.  Abdominal: Soft. Bowel sounds are normal. He exhibits no distension and no mass. There is no tenderness. There is no rebound and no guarding.  Musculoskeletal: Normal range of motion. He exhibits no tenderness.  Lymphadenopathy:    He has no cervical adenopathy.  Neurological: He is oriented to person, place, and time. He displays normal reflexes. He exhibits normal muscle tone. Coordination normal.  Skin: Skin is warm and dry. No rash noted. He is not diaphoretic. No erythema. No pallor.  Psychiatric: He has a normal mood and affect. His behavior is normal. Judgment and thought content normal.     Lab Results  Component Value Date   WBC 6.2 09/01/2010   HGB 11.7* 09/01/2010   HCT 35.2* 09/01/2010   PLT 221 09/01/2010   CHOL 206* 08/27/2010   TRIG 127.0 08/27/2010   HDL 27.20* 08/27/2010   LDLDIRECT 169.4 08/27/2010   ALT 36 09/01/2010   AST 33 09/01/2010   NA 139 09/01/2010   K 3.3* 09/01/2010   CL 104  09/01/2010   CREATININE 1.04 09/01/2010   BUN 16 09/01/2010   CO2 23 09/01/2010   TSH 13.87* 08/27/2010   HGBA1C 6.8* 08/27/2010       Assessment & Plan:

## 2010-10-29 NOTE — Assessment & Plan Note (Signed)
I will recheck his K+ level today 

## 2010-10-29 NOTE — Assessment & Plan Note (Signed)
This has improved with the treatment of his B12 deficiency and thyroid deficiency

## 2010-10-29 NOTE — Patient Instructions (Signed)
Diabetes, Type 2 Diabetes is a lasting (chronic) disease. In type 2 diabetes, the pancreas does not make enough insulin (a hormone), and the body does not respond normally to the insulin that is made. This type of diabetes was also previously called adult onset diabetes. About 90% of all those who have diabetes have type 2. It usually occurs after the age of 40 but can occur at any age. CAUSES Unlike type 1 diabetes, which happens because insulin is no longer being made, type 2 diabetes happens because the body is making less insulin and has trouble using the insulin properly. SYMPTOMS  Drinking more than usual.   Urinating more than usual.   Blurred vision.   Dry, itchy skin.   Frequent infection like yeast infections in women.   More tired than usual (fatigue).  TREATMENT  Healthy eating.   Exercise.   Medication, if needed.   Monitoring blood glucose (sugar).   Seeing your caregiver regularly.  HOME CARE INSTRUCTIONS  Check your blood glucose (sugar) at least once daily. More frequent monitoring may be necessary, depending on your medications and on how well your diabetes is controlled. Your caregiver will advise you.   Take your medicine as directed by your caregiver.   Do not smoke.   Make wise food choices. Ask your caregiver for information. Weight loss can improve your diabetes.   Learn about low blood glucose (hypoglycemia) and how to treat it.   Get your eyes checked regularly.   Have a yearly physical exam. Have your blood pressure checked. Get your blood and urine tested.   Wear a pendant or bracelet saying that you have diabetes.   Check your feet every night for sores. Let your caregiver know if you have sores that are not healing.  SEEK MEDICAL CARE IF:  You are having problems keeping your blood glucose at target range.   You feel you might be having problems with your medicines.   You have symptoms of an illness that is not improving after 24  hours.   You have a sore or wound that is not healing.   You notice a change in vision or a new problem with your vision.   You develop a fever of more than 100.5.  Document Released: 01/28/2005 Document Re-Released: 02/19/2009 Exit[employer] Patient Information 2011 [employer]. 

## 2010-10-29 NOTE — Assessment & Plan Note (Signed)
Start iron replacement therapy 

## 2010-10-30 LAB — TESTOSTERONE, FREE, TOTAL, SHBG
Sex Hormone Binding: 23 nmol/L (ref 13–71)
Testosterone: 279.2 ng/dL (ref 250–890)

## 2010-11-14 LAB — CBC
HCT: 31.1 — ABNORMAL LOW
Hemoglobin: 10.3 — ABNORMAL LOW
WBC: 7.7

## 2010-11-14 LAB — TYPE AND SCREEN

## 2010-11-14 LAB — ABO/RH: ABO/RH(D): B POS

## 2010-11-14 LAB — POTASSIUM: Potassium: 4

## 2010-11-14 LAB — COMPREHENSIVE METABOLIC PANEL
ALT: 29
AST: 27
Albumin: 3.9
Alkaline Phosphatase: 69
GFR calc Af Amer: >60
Glucose, Bld: 112 — ABNORMAL HIGH
Potassium: 3.9
Sodium: 139
Total Protein: 7.2

## 2010-11-14 LAB — CREATININE, SERUM
Creatinine, Ser: 1.02
GFR calc non Af Amer: >60

## 2011-01-04 ENCOUNTER — Telehealth: Payer: Self-pay | Admitting: Oncology

## 2011-01-04 NOTE — Telephone Encounter (Signed)
lmonvm advising the pt that his dec appts have been r/s to jan per the md's schedule.

## 2011-01-14 ENCOUNTER — Ambulatory Visit: Payer: 59 | Admitting: Oncology

## 2011-01-14 ENCOUNTER — Other Ambulatory Visit: Payer: 59 | Admitting: Lab

## 2011-02-11 ENCOUNTER — Ambulatory Visit: Payer: 59 | Admitting: Internal Medicine

## 2011-02-14 ENCOUNTER — Telehealth: Payer: Self-pay | Admitting: Oncology

## 2011-02-14 NOTE — Telephone Encounter (Signed)
Pt called in requesting to cahnage his appts from 02/21/2011 to 02/18/2011 since he has another appt on that date [place] would like to combine both appts on the same day. Pt is aware he will get a call back either way

## 2011-02-18 ENCOUNTER — Encounter: Payer: Self-pay | Admitting: Internal Medicine

## 2011-02-18 ENCOUNTER — Other Ambulatory Visit (INDEPENDENT_AMBULATORY_CARE_PROVIDER_SITE_OTHER): Payer: 59

## 2011-02-18 ENCOUNTER — Other Ambulatory Visit: Payer: Self-pay | Admitting: Oncology

## 2011-02-18 ENCOUNTER — Other Ambulatory Visit: Payer: Self-pay | Admitting: Nurse Practitioner

## 2011-02-18 ENCOUNTER — Other Ambulatory Visit (HOSPITAL_BASED_OUTPATIENT_CLINIC_OR_DEPARTMENT_OTHER): Payer: 59

## 2011-02-18 ENCOUNTER — Ambulatory Visit (INDEPENDENT_AMBULATORY_CARE_PROVIDER_SITE_OTHER): Payer: 59 | Admitting: Internal Medicine

## 2011-02-18 ENCOUNTER — Telehealth: Payer: Self-pay | Admitting: Oncology

## 2011-02-18 ENCOUNTER — Ambulatory Visit (HOSPITAL_BASED_OUTPATIENT_CLINIC_OR_DEPARTMENT_OTHER): Payer: 59 | Admitting: Nurse Practitioner

## 2011-02-18 VITALS — BP 103/66 | HR 57 | Temp 97.1°F | Ht 73.0 in | Wt 294.0 lb

## 2011-02-18 DIAGNOSIS — C187 Malignant neoplasm of sigmoid colon: Secondary | ICD-10-CM

## 2011-02-18 DIAGNOSIS — IMO0001 Reserved for inherently not codable concepts without codable children: Secondary | ICD-10-CM

## 2011-02-18 DIAGNOSIS — E538 Deficiency of other specified B group vitamins: Secondary | ICD-10-CM

## 2011-02-18 DIAGNOSIS — G620 Drug-induced polyneuropathy: Secondary | ICD-10-CM

## 2011-02-18 DIAGNOSIS — E039 Hypothyroidism, unspecified: Secondary | ICD-10-CM

## 2011-02-18 DIAGNOSIS — D649 Anemia, unspecified: Secondary | ICD-10-CM

## 2011-02-18 DIAGNOSIS — E876 Hypokalemia: Secondary | ICD-10-CM

## 2011-02-18 DIAGNOSIS — R209 Unspecified disturbances of skin sensation: Secondary | ICD-10-CM

## 2011-02-18 DIAGNOSIS — G629 Polyneuropathy, unspecified: Secondary | ICD-10-CM

## 2011-02-18 DIAGNOSIS — G609 Hereditary and idiopathic neuropathy, unspecified: Secondary | ICD-10-CM

## 2011-02-18 DIAGNOSIS — D509 Iron deficiency anemia, unspecified: Secondary | ICD-10-CM

## 2011-02-18 LAB — CBC WITH DIFFERENTIAL/PLATELET
Eosinophils Relative: 4.4 % (ref 0.0–5.0)
HCT: 36.5 % — ABNORMAL LOW (ref 39.0–52.0)
Hemoglobin: 12.1 g/dL — ABNORMAL LOW (ref 13.0–17.0)
Lymphs Abs: 1 10*3/uL (ref 0.7–4.0)
MCV: 74.8 fl — ABNORMAL LOW (ref 78.0–100.0)
Monocytes Absolute: 0.5 10*3/uL (ref 0.1–1.0)
Monocytes Relative: 9.2 % (ref 3.0–12.0)
Neutro Abs: 3.8 10*3/uL (ref 1.4–7.7)
Platelets: 203 10*3/uL (ref 150.0–400.0)
RDW: 17.9 % — ABNORMAL HIGH (ref 11.5–14.6)
WBC: 5.7 10*3/uL (ref 4.5–10.5)

## 2011-02-18 LAB — VITAMIN B12: Vitamin B-12: 113 pg/mL — ABNORMAL LOW (ref 211–911)

## 2011-02-18 LAB — LIPID PANEL
HDL: 28.6 mg/dL — ABNORMAL LOW (ref >39.00)
Triglycerides: 175 mg/dL — ABNORMAL HIGH (ref 0.0–149.0)

## 2011-02-18 LAB — FERRITIN: Ferritin: 7.2 ng/mL — ABNORMAL LOW (ref 22.0–322.0)

## 2011-02-18 LAB — TSH: TSH: 5.34 u[IU]/mL (ref 0.35–5.50)

## 2011-02-18 LAB — CEA: CEA: 0.6 ng/mL (ref 0.0–5.0)

## 2011-02-18 LAB — HEMOGLOBIN A1C: Hgb A1c MFr Bld: 6.2 % (ref 4.6–6.5)

## 2011-02-18 LAB — IBC PANEL: Iron: 26 ug/dL — ABNORMAL LOW (ref 42–165)

## 2011-02-18 LAB — FOLATE: Folate: 12.5 ng/mL (ref >5.9)

## 2011-02-18 NOTE — Progress Notes (Signed)
OFFICE PROGRESS NOTE  Interval history:  Adam Wilson returns as scheduled. He is feeling well. He reports being diagnosed with a B12 deficiency since his last visit here. He is now on monthly B12 injections. Neuropathy symptoms have improved. He continues to note tingling in his fingers and toes. Bowels moving regularly. No hematochezia or melena. He denies abdominal pain.   Objective: Blood pressure 103/66, pulse 57, temperature 97.1 F (36.2 C), temperature source Oral, height 6\' 1"  (1.854 m), weight 294 lb (133.358 kg).  Oropharynx is without thrush or ulceration. No palpable cervical or supraclavicular lymph nodes. Prominent bi- axillary fat pads. Small bilateral inguinal nodes. Lungs are clear. No wheezes or rales. Regular cardiac rhythm. Abdomen soft and nontender. No hepatomegaly. Extremities are without edema. Vibratory sense intact over the fingertips per tuning fork exam.   Lab Results: Lab Results  Component Value Date   WBC 5.7 02/18/2011   HGB 12.1* 02/18/2011   HCT 36.5* 02/18/2011   MCV 74.8* 02/18/2011   PLT 203.0 02/18/2011    Chemistry:    Chemistry      Component Value Date/Time   NA 140 02/18/2011 0958   NA 138 07/16/2010 0857   K 3.6 02/18/2011 0958   K 3.8 07/16/2010 0857   CL 106 02/18/2011 0958   CL 98 07/16/2010 0857   CO2 28 02/18/2011 0958   CO2 29 07/16/2010 0857   BUN 12 02/18/2011 0958   BUN 9 07/16/2010 0857   CREATININE 0.8 02/18/2011 0958   CREATININE 0.7 07/16/2010 0857   GLU 135* 07/26/2010 1133      Component Value Date/Time   CALCIUM 9.1 02/18/2011 0958   CALCIUM 8.9 07/16/2010 0857   ALKPHOS 79 02/18/2011 0958   AST 21 02/18/2011 0958   ALT 8 02/18/2011 0958   BILITOT 0.6 02/18/2011 0958       Studies/Results: No results found.  Medications: I have reviewed the patient's current medications.  Assessment/Plan:  1. Rectal cancer, status post neoadjuvant fusional 5-FU and concurrent radiation, followed by low anterior resection and diverting ileostomy, 10/22/2007.  He  completed 9 cycles of adjuvant FOLFOX chemotherapy 03/28/2008.  Restaging CT evaluation 06/19/2009 was negative for evidence of metastatic disease.  Restaging CT evaluation 07/16/2010 was negative for evidence of metastatic disease. 2. 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  The neuropathy symptoms initially improved but have persisted.  Neurontin did not help his symptoms. 3. Small bilateral inguinal lymph nodes, 11/28/2008 - Stable. 4. History of thrombocytopenia secondary to chemotherapy.   5. History of microcytic anemia - he has a microcytic anemia on labs done earlier today. We will check a ferritin level. 6. Pelvic lymphadenopathy on CT, 02/19/2007, with no pathologically enlarged lymph nodes on pelvic CT, 07/04/2008, 06/19/2009, and 07/16/2010. 7. History of a vertebral compression fracture.  8. Status post Port-A-Cath removal, 06/282010.   9. Status post ileostomy takedown by Dr. Michaell Cowing, 04/25/2008. 10. Erectile dysfunction with a low testosterone level, January 2012 - He was referred to Urology. 11. Elevated glucose on 07/16/2010 - being followed by Dr. Yetta Barre. 12. "Numbness and tingling" in the legs reported when here 07/26/2010 - it was felt unlikely that the numbness/tingling was related to prior oxaliplatin. Symptoms have improved. Question if related to B12 deficiency. 13. B12 deficiency diagnosed July 2012-he is receiving monthly B12 injections. The B12 level was improved but still low when checked through Dr. Yetta Barre office earlier today.  Disposition-[name]  appears stable from an oncology standpoint. We will followup on the CEA and ferritin levels from today. He will return for a followup visit and CEA in 6 months. He will continue to followup with Dr. Yetta Barre regarding the B12 deficiency.   Plan reviewed with Dr. Truett Perna.   Adam Wilson ANP/GNP-BC

## 2011-02-18 NOTE — Assessment & Plan Note (Signed)
TSH today 

## 2011-02-18 NOTE — Assessment & Plan Note (Signed)
Recheck of his K+ level today

## 2011-02-18 NOTE — Telephone Encounter (Signed)
gve the pt his July 2013 appt calendar °

## 2011-02-18 NOTE — Assessment & Plan Note (Signed)
S/s resolved with B12 replacement therapy

## 2011-02-18 NOTE — Patient Instructions (Signed)

## 2011-02-18 NOTE — Assessment & Plan Note (Signed)
CBC and iron levels today 

## 2011-02-18 NOTE — Assessment & Plan Note (Signed)
He has improved with his lifestyle modifications, a1c today to see how well the blood sugars have responded

## 2011-02-18 NOTE — Progress Notes (Signed)
Subjective:    Patient ID: Adam Wilson, male    DOB: 1965-10-27, 46 y.o.   MRN: [id number]  Anemia Presents for follow-up visit. There has been no abdominal pain, anorexia, bruising/bleeding easily, confusion, fever, leg swelling, light-headedness, malaise/fatigue, pallor, palpitations, paresthesias, pica or weight loss. Signs of blood loss that are not present include hematemesis, hematochezia and melena. Compliance problems include psychosocial issues.  Compliance with medications is 26-50%.  Diabetes He presents for his follow-up diabetic visit. He has type 2 diabetes mellitus. His disease course has been stable. There are no hypoglycemic associated symptoms. Pertinent negatives for hypoglycemia include no confusion, dizziness, headaches, pallor, seizures, speech difficulty or tremors. Pertinent negatives for diabetes include no blurred vision, no chest pain, no fatigue, no foot paresthesias, no foot ulcerations, no polydipsia, no polyphagia, no polyuria, no visual change, no weakness and no weight loss. There are no hypoglycemic complications. Symptoms are stable. There are no diabetic complications. Current diabetic treatment includes diet. He is compliant with treatment all of the time. His weight is decreasing steadily. He is following a generally healthy diet. Meal planning includes avoidance of concentrated sweets. He participates in exercise intermittently. There is no change in his home blood glucose trend. An ACE inhibitor/angiotensin II receptor blocker is not being taken. He does not see a podiatrist.Eye exam is current.      Review of Systems  Constitutional: Negative for fever, chills, weight loss, malaise/fatigue, diaphoresis, activity change, appetite change, fatigue and unexpected weight change.  HENT: Negative.  Negative for sore throat, trouble swallowing and voice change.   Eyes: Negative.  Negative for blurred vision.  Respiratory: Negative for cough, chest tightness,  shortness of breath, wheezing and stridor.   Cardiovascular: Negative for chest pain, palpitations and leg swelling.  Gastrointestinal: Negative for nausea, vomiting, abdominal pain, diarrhea, constipation, blood in stool, melena, hematochezia, abdominal distention, anorexia and hematemesis.  Genitourinary: Negative for dysuria, urgency, polyuria, frequency, hematuria, flank pain, decreased urine volume, enuresis and difficulty urinating.  Musculoskeletal: Negative for myalgias, back pain, joint swelling, arthralgias and gait problem.  Skin: Negative for color change, pallor, rash and wound.  Neurological: Negative for dizziness, tremors, seizures, syncope, facial asymmetry, speech difficulty, weakness, light-headedness, numbness, headaches and paresthesias.  Hematological: Negative for polydipsia, polyphagia and adenopathy. Does not bruise/bleed easily.  Psychiatric/Behavioral: Negative.  Negative for confusion.       Objective:   Physical Exam  Vitals reviewed. Constitutional: He is oriented to person, place, and time. He appears well-developed and well-nourished.  HENT:  Head: Normocephalic and atraumatic.  Mouth/Throat: Oropharynx is clear and moist. No oropharyngeal exudate.  Eyes: Conjunctivae are normal. Right eye exhibits no discharge. Left eye exhibits no discharge. No scleral icterus.  Neck: Normal range of motion. Neck supple. No JVD present. No tracheal deviation present. No thyromegaly present.  Cardiovascular: Normal rate, regular rhythm, normal heart sounds and intact distal pulses.  Exam reveals no gallop and no friction rub.   No murmur heard. Pulmonary/Chest: Effort normal and breath sounds normal. No stridor. No respiratory distress. He has no wheezes. He has no rales. He exhibits no tenderness.  Abdominal: Soft. Bowel sounds are normal. He exhibits no distension and no mass. There is no tenderness. There is no rebound and no guarding.  Musculoskeletal: Normal range of  motion. He exhibits no edema and no tenderness.  Lymphadenopathy:    He has no cervical adenopathy.  Neurological: He is oriented to person, place, and time.  Skin: Skin is warm and dry. No rash  noted. He is not diaphoretic. No erythema. No pallor.  Psychiatric: He has a normal mood and affect. His behavior is normal. Judgment and thought content normal.          Lab Results  Component Value Date   WBC 5.6 10/29/2010   HGB 11.4* 10/29/2010   HCT 35.6* 10/29/2010   PLT 194.0 10/29/2010   GLUCOSE 107* 10/29/2010   CHOL 190 10/29/2010   TRIG 162.0* 10/29/2010   HDL 28.30* 10/29/2010   LDLDIRECT 169.4 08/27/2010   LDLCALC 129* 10/29/2010   ALT 25 10/29/2010   AST 20 10/29/2010   NA 137 10/29/2010   K 3.9 10/29/2010   CL 105 10/29/2010   CREATININE 0.9 10/29/2010   BUN 13 10/29/2010   CO2 26 10/29/2010   TSH 4.55 10/29/2010   HGBA1C 6.1 10/29/2010   Assessment & Plan:

## 2011-02-18 NOTE — Assessment & Plan Note (Signed)
He has not been compliant with iron replacement therapy, I will check his CBC and vitamin levels today

## 2011-02-19 LAB — COMPREHENSIVE METABOLIC PANEL
ALT: 24 U/L (ref 0–53)
BUN: 12 mg/dL (ref 6–23)
CO2: 28 mEq/L (ref 19–32)
Calcium: 9.1 mg/dL (ref 8.4–10.5)
Chloride: 106 mEq/L (ref 96–112)
Creatinine, Ser: 0.8 mg/dL (ref 0.4–1.5)
GFR: 116.84 mL/min (ref >60.00)
Total Bilirubin: 0.6 mg/dL (ref 0.3–1.2)

## 2011-02-19 LAB — FERRITIN: Ferritin: 9 ng/mL — ABNORMAL LOW (ref 22–322)

## 2011-02-21 ENCOUNTER — Ambulatory Visit: Payer: 59 | Admitting: Oncology

## 2011-02-21 ENCOUNTER — Other Ambulatory Visit: Payer: 59 | Admitting: Lab

## 2011-02-28 ENCOUNTER — Telehealth: Payer: Self-pay | Admitting: *Deleted

## 2011-02-28 NOTE — Telephone Encounter (Signed)
Message copied by Wandalee Ferdinand on Thu Feb 28, 2011  6:49 PM ------      Message from: Ladene Artist      Created: Sun Feb 24, 2011  7:22 PM       Please call patient, CEA is normal            Iron and B12 are low.  He needs a referral to his gastroenterologist to evaluate for bleeding/malabsorption

## 2011-02-28 NOTE — Telephone Encounter (Signed)
Left patient message regarding labs and MD suggestion to follow up with Dr. Arlyce Dice regarding low iron and B12.  Will forward note and labs to his office.

## 2011-03-08 ENCOUNTER — Telehealth: Payer: Self-pay | Admitting: *Deleted

## 2011-03-08 NOTE — Telephone Encounter (Signed)
Called pt, he states he has not heard from Dr. Marzetta Board office. He plans to call for an appt on 03/11/11.

## 2011-03-15 ENCOUNTER — Encounter: Payer: Self-pay | Admitting: Gastroenterology

## 2011-04-08 ENCOUNTER — Other Ambulatory Visit: Payer: 59

## 2011-04-08 ENCOUNTER — Ambulatory Visit (INDEPENDENT_AMBULATORY_CARE_PROVIDER_SITE_OTHER): Payer: 59 | Admitting: Gastroenterology

## 2011-04-08 ENCOUNTER — Encounter: Payer: Self-pay | Admitting: Gastroenterology

## 2011-04-08 DIAGNOSIS — C187 Malignant neoplasm of sigmoid colon: Secondary | ICD-10-CM

## 2011-04-08 DIAGNOSIS — D509 Iron deficiency anemia, unspecified: Secondary | ICD-10-CM

## 2011-04-08 MED ORDER — PEG-KCL-NACL-NASULF-NA ASC-C 100 G PO SOLR: 1.0000 | Freq: Once | ORAL | Status: DC

## 2011-04-08 NOTE — Assessment & Plan Note (Addendum)
He has an iron deficiency anemia either  from chronic GI blood loss or from  malabsorption. I am suspicious of the latter in view of his B12 deficiency.  Recommendations #1 colonoscopy. If negative I will proceed with upper endoscopy #2 serial Hemoccults #3 small bowel biopsies if he is Hemoccult negative and endoscopic studies are negative for a bleeding source #4 to consider IV iron infusion since he does not tolerate by mouth iron

## 2011-04-08 NOTE — Patient Instructions (Addendum)
Colonoscopy Your Colonoscopy is scheduled on 04/29/2011 at 11:30am You can pick up your MoviPrep from your pharmacy today   A colonoscopy is an exam to evaluate your entire colon. In this exam, your colon is cleansed. A long fiberoptic tube is inserted through your rectum and into your colon. The fiberoptic scope (endoscope) is a long bundle of enclosed and very flexible fibers. These fibers transmit light to the area examined and send images from that area to your caregiver. Discomfort is usually minimal. You may be given a drug to help you sleep (sedative) during or prior to the procedure. This exam helps to detect lumps (tumors), polyps, inflammation, and areas of bleeding. Your caregiver may also take a small piece of tissue (biopsy) that will be examined under a microscope. LET YOUR CAREGIVER KNOW ABOUT:   Allergies to food or medicine.   Medicines taken, including vitamins, herbs, eyedrops, over-the-counter medicines, and creams.   Use of steroids (by mouth or creams).   Previous problems with anesthetics or numbing medicines.   History of bleeding problems or blood clots.   Previous surgery.   Other health problems, including diabetes and kidney problems.   Possibility of pregnancy, if this applies.  BEFORE THE PROCEDURE   A clear liquid diet may be required for 2 days before the exam.   Ask your caregiver about changing or stopping your regular medications.   Liquid injections (enemas) or laxatives may be required.   A large amount of electrolyte solution may be given to you to drink over a short period of time. This solution is used to clean out your colon.   You should be present 60 minutes prior to your procedure or as directed by your caregiver.  AFTER THE PROCEDURE   If you received a sedative or pain relieving medication, you will need to arrange for someone to drive you home.   Occasionally, there is a little blood passed with the first bowel movement. Do not be  concerned.  FINDING OUT THE RESULTS OF YOUR TEST Not all test results are available during your visit. If your test results are not back during the visit, make an appointment with your caregiver to find out the results. Do not assume everything is normal if you have not heard from your caregiver or the medical facility. It is important for you to follow up on all of your test results. HOME CARE INSTRUCTIONS   It is not unusual to pass moderate amounts of gas and experience mild abdominal cramping following the procedure. This is due to air being used to inflate your colon during the exam. Walking or a warm pack on your belly (abdomen) may help.   You may resume all normal meals and activities after sedatives and medicines have worn off.   Only take over-the-counter or prescription medicines for pain, discomfort, or fever as directed by your caregiver. Do not use aspirin or blood thinners if a biopsy was taken. Consult your caregiver for medicine usage if biopsies were taken.  SEEK IMMEDIATE MEDICAL CARE IF:   You have a fever.   You pass large blood clots or fill a toilet with blood following the procedure. This may also occur 10 to 14 days following the procedure. This is more likely if a biopsy was taken.   You develop abdominal pain that keeps getting worse and cannot be relieved with medicine.  Document Released: 01/26/2000 Document Revised: 10/10/2010 Document Reviewed: 09/10/2007 [facility] Patient Information 2012 [place], [place].  Go to the  basement for you IFOB kit today

## 2011-04-08 NOTE — Progress Notes (Signed)
History of Present Illness:  Adam Wilson is a 46 year old white male referred at the request of Dr. Yetta Barre and Dr. Myrle Sheng for evaluation of a microcytic anemia. He has a history of rectal cancer that was resected in 2009. Followup colonoscopy in 2010 was negative. He received postoperative radiation and chemotherapy. Recent blood work was pertinent for a hemoglobin of 12 and ferritin 7.2. He denies change in bowel habits, rectal bleeding or melena. He has B12 deficiency and has excess gas. He is on no gastric irritants including nonsteroidals.   Past Medical History  Diagnosis Date  . Rectal carcinoma 2009  . Diabetes mellitus   . 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 Kidney stone   . Colon polyps    Past Surgical History  Procedure Date  . Colon surgery 2010  . Tonsillectomy   . Portacath placement   . Ileostomy   . Ileostomy closure   . Umbilical hernia repair    family history includes Arthritis in his mother; Diabetes in his mother; Heart attack in his father; Hyperlipidemia in his father; Hypertension in his father; Stomach cancer in his paternal grandmother; and Stroke in his father. Current Outpatient Prescriptions  Medication Sig Dispense Refill  . cyanocobalamin (,VITAMIN B-12,) 1000 MCG/ML injection Inject 1,000 mcg into the skin every 30 (thirty) days. Please dispense 10 mL vial patient reports injecting twice a month      . levothyroxine (SYNTHROID) 100 MCG tablet Take 1 tablet (100 mcg total) by mouth daily.  90 tablet  3  . sildenafil (VIAGRA) 100 MG tablet Take 1 tablet (100 mg total) by mouth as needed for erectile dysfunction.  10 tablet  11  . SYRINGE-NEEDLE, DISP, 3 ML (B-D INTEGRA SYRINGE) 25G X 5/8" 3 ML MISC Use as directed with b-12 injections  50 each  0   Allergies as of 04/08/2011  . (No Known Allergies)    reports that he has never smoked. He has never used smokeless tobacco. He reports that he drinks alcohol. He reports that he does not use illicit  drugs.     Review of Systems: Pertinent positive and negative review of systems were noted in the above HPI section. All other review of systems were otherwise negative.  Vital signs were reviewed in today's medical record Physical Exam: General: Well developed , well nourished, no acute distress Head: Normocephalic and atraumatic Eyes:  sclerae anicteric, EOMI Ears: Normal auditory acuity Mouth: No deformity or lesions Neck: Supple, no masses or thyromegaly Lungs: Clear throughout to auscultation Heart: Regular rate and rhythm; no murmurs, rubs or bruits Abdomen: Soft, non tender and non distended. No masses, hepatosplenomegaly or hernias noted. Normal Bowel sounds Rectal:deferred Musculoskeletal: Symmetrical with no gross deformities  Skin: No lesions on visible extremities Pulses:  Normal pulses noted Extremities: No clubbing, cyanosis, edema or deformities noted Neurological: Alert oriented x 4, grossly nonfocal Cervical Nodes:  No significant cervical adenopathy Inguinal Nodes: No significant inguinal adenopathy Psychological:  Alert and cooperative. Normal mood and affect

## 2011-04-29 ENCOUNTER — Ambulatory Visit (AMBULATORY_SURGERY_CENTER): Payer: 59 | Admitting: Gastroenterology

## 2011-04-29 ENCOUNTER — Encounter: Payer: Self-pay | Admitting: Gastroenterology

## 2011-04-29 VITALS — BP 110/69 | HR 70 | Temp 98.0°F | Resp 16 | Ht 73.0 in | Wt 290.0 lb

## 2011-04-29 DIAGNOSIS — K6289 Other specified diseases of anus and rectum: Secondary | ICD-10-CM

## 2011-04-29 DIAGNOSIS — Z85048 Personal history of other malignant neoplasm of rectum, rectosigmoid junction, and anus: Secondary | ICD-10-CM

## 2011-04-29 DIAGNOSIS — D509 Iron deficiency anemia, unspecified: Secondary | ICD-10-CM

## 2011-04-29 MED ORDER — SODIUM CHLORIDE 0.9 % IV SOLN: 500.0000 mL | INTRAVENOUS | Status: DC

## 2011-04-29 NOTE — Op Note (Signed)
[facility] [address] [place], [place]  [address]  COLONOSCOPY PROCEDURE REPORT  PATIENT:  Adam, Wilson  MR#:  [id number] BIRTHDATE:  [birth year], 46 yrs. old  GENDER:  male ENDOSCOPIST:  Barbette Hair. Arlyce Dice, MD REF. BY:  Lavada Mesi. Truett Perna, M.D. PROCEDURE DATE:  04/29/2011 PROCEDURE:  Colonoscopy with biopsy ASA CLASS:  Class II INDICATIONS:  Iron deficiency anemia, history of colon cancer Rectal Ca 2009 2010 colon - inflammation at anastamosis MEDICATIONS:   MAC sedation, administered by CRNA propofol 220mg IV  DESCRIPTION OF PROCEDURE:   After the risks benefits and alternatives of the procedure were thoroughly explained, informed consent was obtained.  Digital rectal exam was performed and revealed no abnormalities.   The [id number] endoscope was introduced through the anus and advanced to the cecum, which was identified by both the appendix and ileocecal valve, without limitations.  The quality of the prep was good, using MoviPrep. The instrument was then slowly withdrawn as the colon was fully examined. <<PROCEDUREIMAGES>>  FINDINGS:  A sessile polyp was found. 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 Small amount of fresh blood and oozing is present. Biopsies were taken (see image10).  Mild diverticulosis was found in the sigmoid colon (see image5).  This was otherwise a normal examination of the colon (see image2).   Retroflexed views in the rectum revealed not done. The time to cecum =  1) 1.50  minutes. The scope was then withdrawn in  1) 7.50  minutes from the cecum and the procedure completed. 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 Arlyce Dice, MD  CC:  Etta Grandchild, MD  n. Rosalie Doctor:   Barbette Hair. Chau Sawin at 04/29/2011 12:01 PM  Cleotis Nipper, [phone]

## 2011-04-29 NOTE — Patient Instructions (Signed)
YOU HAD AN ENDOSCOPIC PROCEDURE TODAY AT THE [facility]: Refer to the procedure report that was given to you for any specific questions about what was found during the examination.  If the procedure report does not answer your questions, please call your gastroenterologist to clarify.  If you requested that your care partner not be given the details of your procedure findings, then the procedure report has been included in a sealed envelope for you to review at your convenience later.  YOU SHOULD EXPECT: Some feelings of bloating in the abdomen. Passage of more gas than usual.  Walking can help get rid of the air that was put into your GI tract during the procedure and reduce the bloating. If you had a lower endoscopy (such as a colonoscopy or flexible sigmoidoscopy) you may notice spotting of blood in your stool or on the toilet paper. If you underwent a bowel prep for your procedure, then you may not have a normal bowel movement for a few days.  DIET: Your first meal following the procedure should be a light meal and then it is ok to progress to your normal diet.  A half-sandwich or bowl of soup is an example of a good first meal.  Heavy or fried foods are harder to digest and may make you feel nauseous or bloated.  Likewise meals heavy in dairy and vegetables can cause extra gas to form and this can also increase the bloating.  Drink plenty of fluids but you should avoid alcoholic beverages for 24 hours.  ACTIVITY: Your care partner should take you home directly after the procedure.  You should plan to take it easy, moving slowly for the rest of the day.  You can resume normal activity the day after the procedure however you should NOT DRIVE or use heavy machinery for 24 hours (because of the sedation medicines used during the test).    SYMPTOMS TO REPORT IMMEDIATELY: A gastroenterologist can be reached at any hour.  During normal business hours, 8:30 AM to 5:00 PM Monday through Friday,  call [phone].  After hours and on weekends, please call the GI answering service at [phone] who will take a message and have the physician on call contact you.   Following lower endoscopy (colonoscopy or flexible sigmoidoscopy):  Excessive amounts of blood in the stool  Significant tenderness or worsening of abdominal pains  Swelling of the abdomen that is new, acute  Fever of 100F or higher  Following upper endoscopy (EGD)  Vomiting of blood or coffee ground material  New chest pain or pain under the shoulder blades  Painful or persistently difficult swallowing  New shortness of breath  Fever of 100F or higher  Black, tarry-looking stools  FOLLOW UP: If any biopsies were taken you will be contacted by phone or by letter within the next 1-3 weeks.  Call your gastroenterologist if you have not heard about the biopsies in 3 weeks.  Our staff will call the home number listed on your records the next business day following your procedure to check on you and address any questions or concerns that you may have at that time regarding the information given to you following your procedure. This is a courtesy call and so if there is no answer at the home number and we have not heard from you through the emergency physician on call, we will assume that you have returned to your regular daily activities without incident.  SIGNATURES/CONFIDENTIALITY: You and/or your care   partner have signed paperwork which will be entered into your electronic medical record.  These signatures attest to the fact that that the information above on your After Visit Summary has been reviewed and is understood.  Full responsibility of the confidentiality of this discharge information lies with you and/or your care-partner.  

## 2011-04-29 NOTE — Progress Notes (Signed)
Patient did not experience any of the following events: a burn prior to discharge; a fall within the facility; wrong site/side/patient/procedure/implant event; or a hospital transfer or hospital admission upon discharge from the facility. (G8907) Patient did not have preoperative order for IV antibiotic SSI prophylaxis. (G8918)  

## 2011-04-30 ENCOUNTER — Telehealth: Payer: Self-pay | Admitting: *Deleted

## 2011-04-30 NOTE — Telephone Encounter (Signed)
Message left

## 2011-05-06 ENCOUNTER — Ambulatory Visit (AMBULATORY_SURGERY_CENTER): Payer: 59 | Admitting: *Deleted

## 2011-05-06 VITALS — Ht 73.0 in | Wt 294.4 lb

## 2011-05-06 DIAGNOSIS — D509 Iron deficiency anemia, unspecified: Secondary | ICD-10-CM

## 2011-05-09 ENCOUNTER — Other Ambulatory Visit: Payer: Self-pay | Admitting: Gastroenterology

## 2011-05-09 DIAGNOSIS — D649 Anemia, unspecified: Secondary | ICD-10-CM

## 2011-05-16 ENCOUNTER — Other Ambulatory Visit: Payer: Self-pay | Admitting: Gastroenterology

## 2011-05-16 ENCOUNTER — Ambulatory Visit (AMBULATORY_SURGERY_CENTER): Payer: 59 | Admitting: Gastroenterology

## 2011-05-16 ENCOUNTER — Telehealth: Payer: Self-pay

## 2011-05-16 ENCOUNTER — Encounter: Payer: Self-pay | Admitting: Gastroenterology

## 2011-05-16 VITALS — BP 132/72 | HR 66 | Temp 96.0°F | Resp 15 | Ht 73.0 in | Wt 290.0 lb

## 2011-05-16 DIAGNOSIS — D509 Iron deficiency anemia, unspecified: Secondary | ICD-10-CM

## 2011-05-16 DIAGNOSIS — D1330 Benign neoplasm of unspecified part of small intestine: Secondary | ICD-10-CM

## 2011-05-16 MED ORDER — SODIUM CHLORIDE 0.9 % IV SOLN: 500.0000 mL | INTRAVENOUS | Status: DC

## 2011-05-16 NOTE — Telephone Encounter (Signed)
Pt scheduled for flex-sig with apc at [facility] 06/05/11 arrival time 9am for a 10am appt. Pt aware of appt date and time. Prep instructions mailed to pt.

## 2011-05-16 NOTE — Progress Notes (Signed)
Patient did not have preoperative order for IV antibiotic SSI prophylaxis. (G8918)  Patient did not experience any of the following events: a burn prior to discharge; a fall within the facility; wrong site/side/patient/procedure/implant event; or a hospital transfer or hospital admission upon discharge from the facility. (G8907)  

## 2011-05-16 NOTE — Patient Instructions (Signed)
YOU HAD AN ENDOSCOPIC PROCEDURE TODAY AT THE [facility]: Refer to the procedure report that was given to you for any specific questions about what was found during the examination.  If the procedure report does not answer your questions, please call your gastroenterologist to clarify.  If you requested that your care partner not be given the details of your procedure findings, then the procedure report has been included in a sealed envelope for you to review at your convenience later.  YOU SHOULD EXPECT: Some feelings of bloating in the abdomen. Passage of more gas than usual.  Walking can help get rid of the air that was put into your GI tract during the procedure and reduce the bloating. If you had a lower endoscopy (such as a colonoscopy or flexible sigmoidoscopy) you may notice spotting of blood in your stool or on the toilet paper. If you underwent a bowel prep for your procedure, then you may not have a normal bowel movement for a few days.  DIET: Your first meal following the procedure should be a light meal and then it is ok to progress to your normal diet.  A half-sandwich or bowl of soup is an example of a good first meal.  Heavy or fried foods are harder to digest and may make you feel nauseous or bloated.  Likewise meals heavy in dairy and vegetables can cause extra gas to form and this can also increase the bloating.  Drink plenty of fluids but you should avoid alcoholic beverages for 24 hours.  ACTIVITY: Your care partner should take you home directly after the procedure.  You should plan to take it easy, moving slowly for the rest of the day.  You can resume normal activity the day after the procedure however you should NOT DRIVE or use heavy machinery for 24 hours (because of the sedation medicines used during the test).    SYMPTOMS TO REPORT IMMEDIATELY: A gastroenterologist can be reached at any hour.  During normal business hours, 8:30 AM to 5:00 PM Monday through Friday,  call [phone].  After hours and on weekends, please call the GI answering service at [phone] who will take a message and have the physician on call contact you.  YOU HAD AN ENDOSCOPIC PROCEDURE TODAY AT THE [facility]: Refer to the procedure report that was given to you for any specific questions about what was found during the examination.  If the procedure report does not answer your questions, please call your gastroenterologist to clarify.  If you requested that your care partner not be given the details of your procedure findings, then the procedure report has been included in a sealed envelope for you to review at your convenience later.  YOU SHOULD EXPECT: Some feelings of bloating in the abdomen. Passage of more gas than usual.  Walking can help get rid of the air that was put into your GI tract during the procedure and reduce the bloating. If you had a lower endoscopy (such as a colonoscopy or flexible sigmoidoscopy) you may notice spotting of blood in your stool or on the toilet paper. If you underwent a bowel prep for your procedure, then you may not have a normal bowel movement for a few days.  DIET: Your first meal following the procedure should be a light meal and then it is ok to progress to your normal diet.  A half-sandwich or bowl of soup is an example of a good first meal.  Heavy   or fried foods are harder to digest and may make you feel nauseous or bloated.  Likewise meals heavy in dairy and vegetables can cause extra gas to form and this can also increase the bloating.  Drink plenty of fluids but you should avoid alcoholic beverages for 24 hours.  ACTIVITY: Your care partner should take you home directly after the procedure.  You should plan to take it easy, moving slowly for the rest of the day.  You can resume normal activity the day after the procedure however you should NOT DRIVE or use heavy machinery for 24 hours (because of the sedation medicines used  during the test).    SYMPTOMS TO REPORT IMMEDIATELY: A gastroenterologist can be reached at any hour.  During normal business hours, 8:30 AM to 5:00 PM Monday through Friday, call [phone].  After hours and on weekends, please call the GI answering service at [phone] who will take a message and have the physician on call contact you.  Following lower endoscopy (colonoscopy or flexible sigmoidoscopy):  Excessive amounts of blood in the stool  Significant tenderness or worsening of abdominal pains  Swelling of the abdomen that is new, acute  Fever of 100F or higher Following upper endoscopy (EGD)  Vomiting of blood or coffee ground material  New chest pain or pain under the shoulder blades  Painful or persistently difficult swallowing  New shortness of breath  Fever of 100F or higher  Black, tarry-looking stools  FOLLOW UP: If any biopsies were taken you will be contacted by phone or by letter within the next 1-3 weeks.  Call your gastroenterologist if you have not heard about the biopsies in 3 weeks.  Our staff will call the home number listed on your records the next business day following your procedure to check on you and address any questions or concerns that you may have at that time regarding the information given to you following your procedure. This is a courtesy call and so if there is no answer at the home number and we have not heard from you through the emergency physician on call, we will assume that you have returned to your regular daily activities without incident.  SIGNATURES/CONFIDENTIALITY:  You and/or your care partner have signed paperwork which will be entered into your electronic medical record.  These signatures attest to the fact that that the information above on your After Visit Summary has been reviewed and is understood.  Full responsibility of the confidentiality of this discharge information lies with you and/or your care-partner. Following upper  endoscopy (EGD)  Vomiting of blood or coffee ground material  New chest pain or pain under the shoulder blades  Painful or persistently difficult swallowing  New shortness of breath  Fever of 100F or higher  Black, tarry-looking stools  FOLLOW UP: If any biopsies were taken you will be contacted by phone or by letter within the next 1-3 weeks.  Call your gastroenterologist if you have not heard about the biopsies in 3 weeks.  Our staff will call the home number listed on your records the next business day following your procedure to check on you and address any questions or concerns that you may have at that time regarding the information given to you following your procedure. This is a courtesy call and so if there is no answer at the home number and we have not heard from you through the emergency physician on call, we will assume that you have returned to   your regular daily activities without incident.  SIGNATURES/CONFIDENTIALITY: You and/or your care partner have signed paperwork which will be entered into your electronic medical record.  These signatures attest to the fact that that the information above on your After Visit Summary has been reviewed and is understood.  Full responsibility of the confidentiality of this discharge information lies with you and/or your care-partner.  

## 2011-05-16 NOTE — Op Note (Signed)
[facility] [address] [place], [place]  [address]  ENDOSCOPY PROCEDURE REPORT  PATIENT:  Adam, Wilson  MR#:  [id number] BIRTHDATE:  [birth year], 46 yrs. old  GENDER:  male  ENDOSCOPIST:  Barbette Hair. Arlyce Dice, MD Referred by:  PROCEDURE DATE:  05/16/2011 PROCEDURE:  EGD with biopsy, 19147 ASA CLASS:  Class II INDICATIONS:  iron deficiency anemia  MEDICATIONS:   MAC sedation, administered by CRNA propofol 150mg IV, glycopyrrolate (Robinal) 0.2 mg IV, 0.6cc simethancone 0.6 cc PO TOPICAL ANESTHETIC:  DESCRIPTION OF PROCEDURE:   After the risks and benefits of the procedure were explained, informed consent was obtained.  The LB [id number] endoscope was introduced through the mouth and advanced to the third portion of the duodenum.  The instrument was slowly withdrawn as the mucosa was fully examined. <<PROCEDUREIMAGES>>  The upper, middle, and distal third of the esophagus were carefully inspected and no abnormalities were noted. The z-line was well seen at the GEJ. The endoscope was pushed into the fundus which was normal including a retroflexed view. The antrum,gastric body, first and second part of the duodenum were unremarkable (see image1, image2, and image3). Small bowel biopsies were taken to r/o celiac disease    Retroflexed views revealed no abnormalities. The scope was then withdrawn from the patient and the procedure completed.  COMPLICATIONS:  None  ENDOSCOPIC IMPRESSION: 1) Normal EGD RECOMMENDATIONS: 1) Await biopsy results 2) hemmoccult stools in 1 week  ______________________________ Barbette Hair. Arlyce Dice, MD  CC:  Etta Grandchild, MD, Rolm Baptise, MD  n. eSIGNED:   Barbette Hair. Sol Englert at 05/16/2011 09:08 AM  Cleotis Nipper, [phone]

## 2011-05-17 ENCOUNTER — Telehealth: Payer: Self-pay | Admitting: *Deleted

## 2011-05-17 NOTE — Telephone Encounter (Signed)
Spoke with wife. States pt. Is doing "fine", tolerating diet with no complaints.  Encouraged wife to have pt. Call if any questions or concerns.

## 2011-05-27 ENCOUNTER — Encounter: Payer: Self-pay | Admitting: Gastroenterology

## 2011-06-03 ENCOUNTER — Telehealth: Payer: Self-pay | Admitting: Gastroenterology

## 2011-06-03 NOTE — Telephone Encounter (Signed)
Reviewed prep instructions with pt.

## 2011-06-05 ENCOUNTER — Other Ambulatory Visit: Payer: Self-pay | Admitting: Gastroenterology

## 2011-06-05 ENCOUNTER — Ambulatory Visit (HOSPITAL_COMMUNITY)
Admission: RE | Admit: 2011-06-05 | Discharge: 2011-06-05 | Disposition: A | Discharge status: Home / Self Care | Payer: 59 | Source: Ambulatory Visit | Attending: Gastroenterology | Admitting: Gastroenterology

## 2011-06-05 ENCOUNTER — Encounter (HOSPITAL_COMMUNITY)
Admission: RE | Disposition: A | Discharge status: Home / Self Care | Payer: Self-pay | Source: Ambulatory Visit | Attending: Gastroenterology

## 2011-06-05 ENCOUNTER — Encounter (HOSPITAL_COMMUNITY): Payer: Self-pay | Admitting: *Deleted

## 2011-06-05 DIAGNOSIS — D509 Iron deficiency anemia, unspecified: Secondary | ICD-10-CM

## 2011-06-05 DIAGNOSIS — Z85048 Personal history of other malignant neoplasm of rectum, rectosigmoid junction, and anus: Secondary | ICD-10-CM | POA: Insufficient documentation

## 2011-06-05 DIAGNOSIS — D500 Iron deficiency anemia secondary to blood loss (chronic): Secondary | ICD-10-CM | POA: Insufficient documentation

## 2011-06-05 DIAGNOSIS — D126 Benign neoplasm of colon, unspecified: Secondary | ICD-10-CM

## 2011-06-05 DIAGNOSIS — Z860100 Personal history of colon polyps, unspecified: Secondary | ICD-10-CM | POA: Insufficient documentation

## 2011-06-05 DIAGNOSIS — L918 Other hypertrophic disorders of the skin: Secondary | ICD-10-CM | POA: Insufficient documentation

## 2011-06-05 DIAGNOSIS — E119 Type 2 diabetes mellitus without complications: Secondary | ICD-10-CM | POA: Insufficient documentation

## 2011-06-05 DIAGNOSIS — K922 Gastrointestinal hemorrhage, unspecified: Secondary | ICD-10-CM | POA: Insufficient documentation

## 2011-06-05 HISTORY — PX: HOT HEMOSTASIS: SHX5433

## 2011-06-05 HISTORY — PX: FLEXIBLE SIGMOIDOSCOPY: SHX5431

## 2011-06-05 SURGERY — SIGMOIDOSCOPY, FLEXIBLE
Anesthesia: Moderate Sedation

## 2011-06-05 MED ORDER — MIDAZOLAM HCL 10 MG/2ML IJ SOLN
INTRAMUSCULAR | Status: DC | PRN
  Administered 2011-06-05 (×4): 2 mg via INTRAVENOUS

## 2011-06-05 MED ORDER — SODIUM CHLORIDE 0.9 % IV SOLN
Freq: Once | INTRAVENOUS | Status: AC
  Administered 2011-06-05: 500 mL via INTRAVENOUS

## 2011-06-05 MED ORDER — FENTANYL CITRATE 0.05 MG/ML IJ SOLN
INTRAMUSCULAR | Status: DC | PRN
  Administered 2011-06-05 (×4): 25 ug via INTRAVENOUS

## 2011-06-05 MED ORDER — FENTANYL CITRATE 0.05 MG/ML IJ SOLN
INTRAMUSCULAR | Status: AC
  Filled 2011-06-05: qty 4

## 2011-06-05 MED ORDER — DIPHENHYDRAMINE HCL 50 MG/ML IJ SOLN
INTRAMUSCULAR | Status: AC
  Filled 2011-06-05: qty 1

## 2011-06-05 MED ORDER — MIDAZOLAM HCL 10 MG/2ML IJ SOLN
INTRAMUSCULAR | Status: AC
  Filled 2011-06-05: qty 4

## 2011-06-05 NOTE — Op Note (Signed)
[facility] [address] [place], [place]  [address]  FLEXIBLE SIGMOIDOSCOPY PROCEDURE REPORT  PATIENT:  Adam Wilson, Adam Wilson  MR#:  [id number] BIRTHDATE:  10-Feb-1966, 46 yrs. old  GENDER:  male  ENDOSCOPIST:  Barbette Hair. Arlyce Dice, MD Referred by:  Lavada Mesi Truett Perna, M.D. Etta Grandchild, M.D.  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  Digital rectal exam was performed and revealed no abnormalities.   The EC - [id number] ([id number]) endoscope was introduced through the anus and advanced to the sigmoid colon, without limitations.  The quality of the prep was .  The instrument was then slowly withdrawn as the mucosa was fully examined. <<PROCEDUREIMAGES>>  other finding anastomosis The anastomosis was approximately 3 cm from the anal verge. At the anastomosis there was a 2 cm area of friable, bleeding mucosa corresponding to the biopsy proven granulation tissue. This area was fulgerated and destroyed utilizing the argon plasma coagulator (see image1 and image3). Retroflexed views in the rectum revealed not possible.    The scope was then withdrawn from the patient and the procedure terminated.  COMPLICATIONS:  None  ENDOSCOPIC IMPRESSION: Bleeding granulation tissue-status post destruction by argon plasma coagulator  RECOMMENDATIONS: Followup CBC and Hemoccults in one month  REPEAT EXAM:  No  ______________________________ Barbette Hair. Arlyce Dice, MD  CC:  n. eSIGNED:   Barbette Hair. Amar Sippel at 06/05/2011 10:24 AM  Cleotis Nipper, [phone]

## 2011-06-05 NOTE — Discharge Instructions (Signed)
Flexible Sigmoidoscopy Your caregiver has ordered a flexible sigmoidoscopy. This is an exam to evaluate your lower colon. In this exam your colon is cleansed and a short fiber optic tube is inserted through your rectum and into your colon. The fiber optic scope (endoscope) is a short bundle of enclosed flexible small glass fibers. It transmits light to the area examined and images from that area to your caregiver. You do not have to worry about glass breakage in the endoscope. Discomfort is usually minimal. Sedatives and pain medications are generally not required. This exam helps to detect tumors (lumps), polyps, inflammation (swelling and soreness), and areas of bleeding. It may also be used to take biopsies. These are small pieces of tissue taken to examine under a microscope. LET YOUR CAREGIVER KNOW ABOUT:  Allergies.   Medications taken including herbs, eye drops, over the counter medications, and creams.   Use of steroids (by mouth or creams).   Previous problems with anesthetics or novocaine   Possibility of pregnancy, if this applies.   History of blood clots (thrombophlebitis).   History of bleeding or blood problems.   Previous surgery.   Other health problems.  BEFORE THE PROCEDURE Eat normally the night before the exam. Your caregiver may order a mild enema or laxative the night before. No eating or drinking should occur after midnight until the procedure is completed. A rectal suppository or enemas may be given in the morning prior to your procedure. You will be brought to the examination area in a hospital gown. You should be present 60 minutes prior to your procedure or as directed.  AFTER THE PROCEDURE   There is sometimes a little blood passed with the first bowel movement. Do not be concerned. Because air is often used during the exam, it is not unusual to pass gas and experience abdominal (belly) cramping. Walking or a warm pack on your abdomen may help with this. Do not  sleep with a heating pad as burns can occur.   You may resume all normal eating and activities.   Only take over-the-counter or prescription medicines for pain, discomfort, or fever as directed by your caregiver. Do not use aspirin or blood thinners if a biopsy (tissue sample) was taken. Consult your caregiver for medication usage if biopsies were taken.   Call for your results as instructed by your caregiver. Remember, it is your responsibility to obtain the results of your biopsy. Do not assume everything is fine because you do not hear from your caregiver.  SEEK IMMEDIATE MEDICAL CARE IF:  An oral temperature above 102 F (38.9 C) develops.   You pass large blood clots or fill a toilet with blood following the procedure. This may also occur 10 to 14 days following the procedure. It is more likely if a biopsy was taken.   You develop abdominal pain not relieved with medication or that is getting worse rather than better.  Document Released: 01/26/2000 Document Revised: 01/17/2011 Document Reviewed: 11/07/2004 [employer] Patient Information 2012 [employer]. 

## 2011-06-05 NOTE — H&P (Signed)
  History of Present Illness: [name] has a history rectal cancer and is status post resection. Recent colonoscopy for Hemoccult positive stool and iron deficiency anemia demonstrated friable mucosa from granulation tissue this is likely source of bleeding, at the surgical anastomosis. He is scheduled for a sigmoidoscopy with destruction of this granulation tissue.    Past Medical History  Diagnosis Date  . Rectal carcinoma 2009  . Diabetes mellitus   . 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 Kidney stone   . Colon polyps    Past Surgical History  Procedure Date  . Colon surgery 2010  . Tonsillectomy   . Portacath placement   . Ileostomy   . Ileostomy closure   . Umbilical hernia repair   . Hernia repair    family history includes Arthritis in his mother; Diabetes in his mother; Heart attack in his father; Hyperlipidemia in his father; Hypertension in his father; Stomach cancer in his paternal grandmother; and Stroke in his father.  There is no history of Colon cancer, and Esophageal cancer, and Rectal cancer, . Current Facility-Administered Medications  Medication Dose Route Frequency Provider Last Rate Last Dose  . 0.9 %  sodium chloride infusion   Intravenous Once Louis Meckel, MD 20 mL/hr at 06/05/11 0859 500 mL at 06/05/11 0859   Allergies as of 05/16/2011  . (No Known Allergies)    reports that he has never smoked. He has never used smokeless tobacco. He reports that he does not drink alcohol or use illicit drugs.     Review of Systems: Pertinent positive and negative review of systems were noted in the above HPI section. All other review of systems were otherwise negative.  Vital signs were reviewed in today's medical record Physical Exam: General: Well developed , well nourished, no acute distress Head: Normocephalic and atraumatic Eyes:  sclerae anicteric, EOMI Ears: Normal auditory acuity Mouth: No deformity or lesions Neck: Supple, no masses or  thyromegaly Lungs: Clear throughout to auscultation Heart: Regular rate and rhythm; no murmurs, rubs or bruits Abdomen: Soft, non tender and non distended. No masses, hepatosplenomegaly or hernias noted. Normal Bowel sounds Rectal:deferred Musculoskeletal: Symmetrical with no gross deformities  Skin: No lesions on visible extremities Pulses:  Normal pulses noted Extremities: No clubbing, cyanosis, edema or deformities noted Neurological: Alert oriented x 4, grossly nonfocal Cervical Nodes:  No significant cervical adenopathy Inguinal Nodes: No significant inguinal adenopathy Psychological:  Alert and cooperative. 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

## 2011-06-07 ENCOUNTER — Encounter (HOSPITAL_COMMUNITY): Payer: Self-pay | Admitting: Gastroenterology

## 2011-06-17 ENCOUNTER — Encounter: Payer: Self-pay | Admitting: Internal Medicine

## 2011-06-17 ENCOUNTER — Other Ambulatory Visit (INDEPENDENT_AMBULATORY_CARE_PROVIDER_SITE_OTHER): Payer: 59

## 2011-06-17 ENCOUNTER — Ambulatory Visit (INDEPENDENT_AMBULATORY_CARE_PROVIDER_SITE_OTHER): Payer: 59 | Admitting: Internal Medicine

## 2011-06-17 VITALS — BP 110/70 | HR 57 | Temp 98.1°F | Resp 16 | Wt 302.5 lb

## 2011-06-17 DIAGNOSIS — G609 Hereditary and idiopathic neuropathy, unspecified: Secondary | ICD-10-CM

## 2011-06-17 DIAGNOSIS — D509 Iron deficiency anemia, unspecified: Secondary | ICD-10-CM

## 2011-06-17 DIAGNOSIS — E876 Hypokalemia: Secondary | ICD-10-CM

## 2011-06-17 DIAGNOSIS — E039 Hypothyroidism, unspecified: Secondary | ICD-10-CM

## 2011-06-17 DIAGNOSIS — D649 Anemia, unspecified: Secondary | ICD-10-CM

## 2011-06-17 DIAGNOSIS — IMO0001 Reserved for inherently not codable concepts without codable children: Secondary | ICD-10-CM

## 2011-06-17 DIAGNOSIS — D518 Other vitamin B12 deficiency anemias: Secondary | ICD-10-CM

## 2011-06-17 DIAGNOSIS — D519 Vitamin B12 deficiency anemia, unspecified: Secondary | ICD-10-CM

## 2011-06-17 DIAGNOSIS — G629 Polyneuropathy, unspecified: Secondary | ICD-10-CM

## 2011-06-17 LAB — CBC WITH DIFFERENTIAL/PLATELET
Basophils Relative: 0.6 % (ref 0.0–3.0)
Eosinophils Relative: 4.3 % (ref 0.0–5.0)
HCT: 36.8 % — ABNORMAL LOW (ref 39.0–52.0)
Hemoglobin: 12 g/dL — ABNORMAL LOW (ref 13.0–17.0)
Lymphs Abs: 1.3 10*3/uL (ref 0.7–4.0)
Monocytes Relative: 11.2 % (ref 3.0–12.0)
Neutro Abs: 4.1 10*3/uL (ref 1.4–7.7)
RBC: 4.93 Mil/uL (ref 4.22–5.81)
RDW: 17.4 % — ABNORMAL HIGH (ref 11.5–14.6)
WBC: 6.4 10*3/uL (ref 4.5–10.5)

## 2011-06-17 LAB — IBC PANEL
Iron: 19 ug/dL — ABNORMAL LOW (ref 42–165)
Transferrin: 310.4 mg/dL (ref 212.0–360.0)

## 2011-06-17 LAB — VITAMIN B12: Vitamin B-12: 240 pg/mL (ref 211–911)

## 2011-06-17 LAB — COMPREHENSIVE METABOLIC PANEL
BUN: 14 mg/dL (ref 6–23)
CO2: 24 mEq/L (ref 19–32)
Calcium: 9 mg/dL (ref 8.4–10.5)
Creatinine, Ser: 0.9 mg/dL (ref 0.4–1.5)
GFR: 98.52 mL/min (ref >60.00)
Glucose, Bld: 116 mg/dL — ABNORMAL HIGH (ref 70–99)
Total Bilirubin: 0.5 mg/dL (ref 0.3–1.2)

## 2011-06-17 LAB — FERRITIN: Ferritin: 7.1 ng/mL — ABNORMAL LOW (ref 22.0–322.0)

## 2011-06-17 LAB — HEMOGLOBIN A1C: Hgb A1c MFr Bld: 6.5 % (ref 4.6–6.5)

## 2011-06-17 LAB — TSH: TSH: 8.86 u[IU]/mL — ABNORMAL HIGH (ref 0.35–5.50)

## 2011-06-17 NOTE — Patient Instructions (Signed)
Hypothyroidism The thyroid is a large gland located in the lower front of your neck. The thyroid gland helps control metabolism. Metabolism is how your body handles food. It controls metabolism with the hormone thyroxine. When this gland is underactive (hypothyroid), it produces too little hormone.  CAUSES These include:   Absence or destruction of thyroid tissue.   Goiter due to iodine deficiency.   Goiter due to medications.   Congenital defects (since birth).   Problems with the pituitary. This causes a lack of TSH (thyroid stimulating hormone). This hormone tells the thyroid to turn out more hormone.  SYMPTOMS  Lethargy (feeling as though you have no energy)   Cold intolerance   Weight gain (in spite of normal food intake)   Dry skin   Coarse hair   Menstrual irregularity (if severe, may lead to infertility)   Slowing of thought processes  Cardiac problems are also caused by insufficient amounts of thyroid hormone. Hypothyroidism in the newborn is cretinism, and is an extreme form. It is important that this form be treated adequately and immediately or it will lead rapidly to retarded physical and mental development. DIAGNOSIS  To prove hypothyroidism, your caregiver may do blood tests and ultrasound tests. Sometimes the signs are hidden. It may be necessary for your caregiver to watch this illness with blood tests either before or after diagnosis and treatment. TREATMENT  Low levels of thyroid hormone are increased by using synthetic thyroid hormone. This is a safe, effective treatment. It usually takes about four weeks to gain the full effects of the medication. After you have the full effect of the medication, it will generally take another four weeks for problems to leave. Your caregiver may start you on low doses. If you have had heart problems the dose may be gradually increased. It is generally not an emergency to get rapidly to normal. HOME CARE INSTRUCTIONS   Take  your medications as your caregiver suggests. Let your caregiver know of any medications you are taking or start taking. Your caregiver will help you with dosage schedules.   As your condition improves, your dosage needs may increase. It will be necessary to have continuing blood tests as suggested by your caregiver.   Report all suspected medication side effects to your caregiver.  SEEK MEDICAL CARE IF: Seek medical care if you develop:  Sweating.   Tremulousness (tremors).   Anxiety.   Rapid weight loss.   Heat intolerance.   Emotional swings.   Diarrhea.   Weakness.  SEEK IMMEDIATE MEDICAL CARE IF:  You develop chest pain, an irregular heart beat (palpitations), or a rapid heart beat. MAKE SURE YOU:   Understand these instructions.   Will watch your condition.   Will get help right away if you are not doing well or get worse.  Document Released: 01/28/2005 Document Revised: 01/17/2011 Document Reviewed: 09/18/2007 [facility] Patient Information 2012 [place], [place].Iron Deficiency Anemia There are many types of anemia. Iron deficiency anemia is the most common. Iron deficiency anemia is a decrease in the number of red blood cells caused by too little iron. Without enough iron, your body does not produce enough hemoglobin. Hemoglobin is a substance in red blood cells that carries oxygen to the body's tissues. Iron deficiency anemia may leave you tired and short of breath. CAUSES   Lack of iron in the diet.   This may be seen in infants and children, because there is little iron in milk.   This may be seen in adults  who do not eat enough iron-rich foods.   This may be seen in pregnant or breastfeeding women who do not take iron supplements. There is a much higher need for iron intake at these times.   Poor absorption of iron, as seen with intestinal disorders.   Intestinal bleeding.   Heavy periods.  SYMPTOMS  Mild anemia may not be noticeable. Symptoms may  include:  Fatigue.   Headache.   Pale skin.   Weakness.   Shortness of breath.   Dizziness.   Cold hands and feet.   Fast or irregular heartbeat.  DIAGNOSIS  Diagnosis requires a thorough evaluation and physical exam by your caregiver.  Blood tests are generally used to confirm iron deficiency anemia.   Additional tests may be done to find the underlying cause of your anemia. These may include:   Testing for blood in the stool (fecal occult blood test).   A procedure to see inside the colon and rectum (colonoscopy).   A procedure to see inside the esophagus and stomach (endoscopy).  TREATMENT   Correcting the cause of the iron deficiency is the first step.   Medicines, such as oral contraceptives, can make heavy menstrual flows lighter.   Antibiotics and other medicines can be used to treat peptic ulcers.   Surgery may be needed to remove a bleeding polyp, tumor, or fibroid.   Often, iron supplements (ferrous sulfate) are taken.   For the best iron absorption, take these supplements with an empty stomach.   You may need to take the supplements with food if you cannot tolerate them on an empty stomach. Vitamin C improves the absorption of iron. Your caregiver may recommend taking your iron tablets with a glass of orange juice or vitamin C supplement.   Milk and antacids should not be taken at the same time as iron supplements. They may interfere with the absorption of iron.   Iron supplements can cause constipation. A stool softener is often recommended.   Pregnant and breastfeeding women will need to take extra iron, because their normal diet usually will not provide the required amount.   Patients who cannot tolerate iron by mouth can take it through a vein (intravenously) or by an injection into the muscle.  HOME CARE INSTRUCTIONS   Ask your dietitian for help with diet questions.   Take iron and vitamins as directed by your caregiver.   Eat a diet rich in  iron. Eat liver, lean beef, whole-grain bread, eggs, dried fruit, and dark green leafy vegetables.  SEEK IMMEDIATE MEDICAL CARE IF:   You have a fainting episode. Do not drive yourself. Call your local emergency services (911 in [place] if no other help is available.   You have chest pain, nausea, or vomiting.   You develop severe or increased shortness of breath with activities.   You develop weakness or increased thirst.   You have a rapid heartbeat.   You develop unexplained sweating or become lightheaded when getting up from a chair or bed.  MAKE SURE YOU:   Understand these instructions.   Will watch your condition.   Will get help right away if you are not doing well or get worse.  Document Released: 01/26/2000 Document Revised: 01/17/2011 Document Reviewed: 06/06/2009 [facility] Patient Information 2012 [place], [place].

## 2011-06-17 NOTE — Assessment & Plan Note (Signed)
I will recheck his K+ level today 

## 2011-06-17 NOTE — Assessment & Plan Note (Addendum)
I will check his TSH level today  Late note, his TSH is a little high so I will increase his synthroid dose

## 2011-06-17 NOTE — Progress Notes (Signed)
Subjective:    Patient ID: Adam Wilson, male    DOB: 1965/11/27, 46 y.o.   MRN: [id number]  Thyroid Problem Presents for follow-up visit. Symptoms include weight gain. Patient reports no anxiety, cold intolerance, constipation, depressed mood, diaphoresis, diarrhea, dry skin, fatigue, hair loss, heat intolerance, hoarse voice, leg swelling, menstrual problem, nail problem, palpitations, tremors, visual change or weight loss. The symptoms have been improving.  Anemia Presents for follow-up visit. There has been no abdominal pain, anorexia, bruising/bleeding easily, confusion, fever, leg swelling, light-headedness, malaise/fatigue, pallor, palpitations, paresthesias, pica or weight loss. Signs of blood loss that are not present include hematemesis, hematochezia and melena. There are no compliance problems.  Compliance with medications is 76-100%.      Review of Systems  Constitutional: Positive for weight gain and unexpected weight change (some weight gain). Negative for fever, chills, weight loss, malaise/fatigue, diaphoresis, activity change, appetite change and fatigue.  HENT: Negative.  Negative for hoarse voice.   Eyes: Negative.   Respiratory: Negative for apnea, cough, choking, chest tightness, shortness of breath, wheezing and stridor.   Cardiovascular: Negative for chest pain, palpitations and leg swelling.  Gastrointestinal: Negative for nausea, vomiting, abdominal pain, diarrhea, constipation, blood in stool, melena, hematochezia, abdominal distention, anal bleeding, rectal pain, anorexia and hematemesis.  Genitourinary: Negative.  Negative for menstrual problem.  Musculoskeletal: Negative.   Skin: Negative for color change, pallor, rash and wound.  Neurological: Negative.  Negative for tremors, light-headedness and paresthesias.  Hematological: Negative for cold intolerance, heat intolerance and adenopathy. Does not bruise/bleed easily.  Psychiatric/Behavioral: Negative for  confusion.       Objective:   Physical Exam  Vitals reviewed. Constitutional: He is oriented to person, place, and time. He appears well-developed and well-nourished. No distress.  HENT:  Head: Normocephalic and atraumatic.  Mouth/Throat: Oropharynx is clear and moist. No oropharyngeal exudate.  Eyes: Conjunctivae are normal. Right eye exhibits no discharge. Left eye exhibits no discharge. No scleral icterus.  Neck: Normal range of motion. Neck supple. No JVD present. No tracheal deviation present. No thyromegaly present.  Cardiovascular: Normal rate, regular rhythm, normal heart sounds and intact distal pulses.  Exam reveals no gallop and no friction rub.   No murmur heard. Pulmonary/Chest: Effort normal and breath sounds normal. No stridor. No respiratory distress. He has no wheezes. He has no rales. He exhibits no tenderness.  Abdominal: Soft. Bowel sounds are normal. He exhibits no distension and no mass. There is no tenderness. There is no rebound and no guarding.  Musculoskeletal: Normal range of motion. He exhibits no edema and no tenderness.  Lymphadenopathy:    He has no cervical adenopathy.  Neurological: He is oriented to person, place, and time.  Skin: Skin is warm and dry. No rash noted. He is not diaphoretic. No erythema. No pallor.  Psychiatric: He has a normal mood and affect. His behavior is normal. Judgment and thought content normal.      Lab Results  Component Value Date   WBC 5.7 02/18/2011   HGB 12.1* 02/18/2011   HCT 36.5* 02/18/2011   PLT 203.0 02/18/2011   GLUCOSE 116* 02/18/2011   CHOL 195 02/18/2011   TRIG 175.0* 02/18/2011   HDL 28.60* 02/18/2011   LDLDIRECT 169.4 08/27/2010   LDLCALC 131* 02/18/2011   ALT 24 02/18/2011   AST 21 02/18/2011   NA 140 02/18/2011   K 3.6 02/18/2011   CL 106 02/18/2011   CREATININE 0.8 02/18/2011   BUN 12 02/18/2011   CO2 28 02/18/2011  TSH 5.34 02/18/2011   HGBA1C 6.2 02/18/2011      Assessment & Plan:

## 2011-06-17 NOTE — Assessment & Plan Note (Signed)
Check his CBC and B12/folate levels today

## 2011-06-17 NOTE — Assessment & Plan Note (Signed)
This has resolved with B12 replacement therapy

## 2011-06-17 NOTE — Assessment & Plan Note (Signed)
I will recheck his a1c today to see if he needs to start meds for DM

## 2011-06-18 ENCOUNTER — Encounter: Payer: Self-pay | Admitting: Internal Medicine

## 2011-06-18 MED ORDER — LEVOTHYROXINE SODIUM 125 MCG PO TABS: 125.0000 ug | ORAL_TABLET | Freq: Every day | ORAL | Status: DC

## 2011-06-18 NOTE — Progress Notes (Signed)
Addended by: Etta Grandchild on: 06/18/2011 07:46 AM   Modules accepted: Orders

## 2011-08-08 ENCOUNTER — Other Ambulatory Visit: Payer: Self-pay | Admitting: *Deleted

## 2011-08-08 ENCOUNTER — Telehealth: Payer: Self-pay | Admitting: Oncology

## 2011-08-08 NOTE — Telephone Encounter (Signed)
called pt and r/s appt on 07/08 to 07/22

## 2011-08-19 ENCOUNTER — Other Ambulatory Visit: Payer: 59 | Admitting: Lab

## 2011-08-19 ENCOUNTER — Ambulatory Visit: Payer: 59 | Admitting: Oncology

## 2011-09-02 ENCOUNTER — Telehealth: Payer: Self-pay | Admitting: *Deleted

## 2011-09-02 ENCOUNTER — Other Ambulatory Visit: Payer: 59 | Admitting: Lab

## 2011-09-02 ENCOUNTER — Ambulatory Visit: Payer: 59 | Admitting: Oncology

## 2011-09-02 ENCOUNTER — Other Ambulatory Visit: Payer: Self-pay | Admitting: *Deleted

## 2011-09-02 NOTE — Telephone Encounter (Signed)
left voice message to inform the patient of the new date and time on 10-03-2011 starting at 11:30am

## 2011-09-02 NOTE — Progress Notes (Signed)
Patient left message at 1200 today that he is out of town and will not make his follow up appointment today. Needs to reschedule.

## 2011-10-03 ENCOUNTER — Ambulatory Visit: Payer: 59 | Admitting: Oncology

## 2011-10-03 ENCOUNTER — Telehealth: Payer: Self-pay | Admitting: *Deleted

## 2011-10-03 ENCOUNTER — Other Ambulatory Visit: Payer: 59 | Admitting: Lab

## 2011-10-03 NOTE — Telephone Encounter (Signed)
Received call from pt cancelling appt for today 10/03/11.  Returned call to pt discussed appt 10/03/11 was not in schedule however note from scheduler from 09/02/11 discussed 8/22 appt. Instructed pt that he has appt already scheduled on 11/04/11 for lab/MD appt.  Pt verbalized understanding.

## 2011-11-04 ENCOUNTER — Ambulatory Visit (HOSPITAL_BASED_OUTPATIENT_CLINIC_OR_DEPARTMENT_OTHER): Payer: 59 | Admitting: Oncology

## 2011-11-04 ENCOUNTER — Telehealth: Payer: Self-pay | Admitting: Oncology

## 2011-11-04 ENCOUNTER — Other Ambulatory Visit: Payer: 59 | Admitting: Lab

## 2011-11-04 VITALS — BP 132/87 | HR 61 | Temp 96.9°F | Resp 18 | Ht 73.0 in | Wt 311.7 lb

## 2011-11-04 DIAGNOSIS — E538 Deficiency of other specified B group vitamins: Secondary | ICD-10-CM

## 2011-11-04 DIAGNOSIS — C19 Malignant neoplasm of rectosigmoid junction: Secondary | ICD-10-CM

## 2011-11-04 DIAGNOSIS — C187 Malignant neoplasm of sigmoid colon: Secondary | ICD-10-CM

## 2011-11-04 DIAGNOSIS — D649 Anemia, unspecified: Secondary | ICD-10-CM

## 2011-11-04 LAB — CEA: CEA: 1.3 ng/mL (ref 0.0–5.0)

## 2011-11-04 NOTE — Telephone Encounter (Signed)
appts made and printed for pt aom °

## 2011-11-04 NOTE — Progress Notes (Signed)
   [facility]    OFFICE PROGRESS NOTE   INTERVAL HISTORY:   She returns as scheduled. He feels well. The neuropathy symptoms in the hands have essentially resolved. He continues to have tingling in the toes and feet. This does not interfere with activity.  He underwent an upper and lower endoscopy evaluation by Dr. Arlyce Dice in the spring after he was noted to have anemia. A bleeding site of "granulation "tissue was noted at the rectum.  Objective:  Vital signs in last 24 hours:  Blood pressure 132/87, pulse 61, temperature 96.9 F (36.1 C), temperature source Oral, resp. rate 18, height 6\' 1"  (1.854 m), weight 311 lb 11.2 oz (141.386 kg).    HEENT: Neck without mass Lymphatics: Prominent bilateral axillary fat pads with 1/2 cm bilateral inguinal nodes. No cervical or supraclavicular nodes Resp: Lungs clear bilaterally Cardio: Regular rate and rhythm GI: No hepatosplenomegaly, no mass Vascular: No leg edema   Lab Results:  CEA 1.3   Medications: I have reviewed the patient's current medications.  Assessment/Plan: 1. Rectal cancer, status post neoadjuvant fusional 5-FU and concurrent radiation, followed by low anterior resection and diverting ileostomy, 10/22/2007. He completed 9 cycles of adjuvant FOLFOX chemotherapy 03/28/2008. Restaging CT evaluation 06/19/2009 was negative for evidence of metastatic disease. Restaging CT evaluation 07/16/2010 was negative for evidence of metastatic disease. 2. 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 The neuropathy symptoms have improved. Neurontin did not help his symptoms. 3. Small bilateral inguinal lymph nodes, 11/28/2008 - Stable. 4. History of thrombocytopenia secondary to chemotherapy.  5. History of iron deficiency anemia-status post an endoscopic evaluation in the spring of 2013: negative EGD, colonoscopy  with granulation tissue at the rectum-status post fulguration 6. Pelvic lymphadenopathy on CT, 02/19/2007, with no pathologically enlarged lymph nodes on pelvic CT, 07/04/2008, 06/19/2009, and 07/16/2010. 7. History of a vertebral compression fracture.  8. Status post Port-A-Cath removal, 06/282010.  9. Status post ileostomy takedown by Dr. Michaell Cowing, 04/25/2008. 10. Erectile dysfunction with a low testosterone level, January 2012 - He was referred to Urology. 11. Elevated glucose on 07/16/2010 - being followed by Dr. Yetta Barre. 12. "Numbness and tingling" in the legs reported when here 07/26/2010 - it was felt unlikely that the numbness/tingling was related to prior oxaliplatin. Symptoms have improved. Question if related to B12 deficiency. B12 deficiency diagnosed July 2012-he is receiving monthly B12 injections.     Disposition:  He remains in clinical remission from rectal cancer. He will return for an office visit and CEA in 6 months. He will continue surveillance colonoscopies with Dr. Arlyce Dice.   Thornton Papas, MD  11/04/2011  3:56 PM

## 2011-11-07 ENCOUNTER — Telehealth: Payer: Self-pay | Admitting: *Deleted

## 2011-11-07 NOTE — Telephone Encounter (Signed)
Message copied by Gala Romney on Thu Nov 07, 2011  2:32 PM ------      Message from: Thornton Papas B      Created: Wed Nov 06, 2011 10:21 PM       Please call patient, cea is normal, f/u as scheduled

## 2011-11-07 NOTE — Telephone Encounter (Signed)
Called and spoke with pt; per Dr. Truett Perna CEA is normal and to f/u as scheduled.  Pt verbalized understanding and confirmed appt for 05/04/12.

## 2011-11-11 ENCOUNTER — Ambulatory Visit (INDEPENDENT_AMBULATORY_CARE_PROVIDER_SITE_OTHER): Payer: 59 | Admitting: Internal Medicine

## 2011-11-11 ENCOUNTER — Encounter: Payer: Self-pay | Admitting: Internal Medicine

## 2011-11-11 VITALS — BP 118/80 | HR 65 | Temp 97.9°F | Resp 14 | Ht 73.0 in | Wt 312.0 lb

## 2011-11-11 DIAGNOSIS — N529 Male erectile dysfunction, unspecified: Secondary | ICD-10-CM

## 2011-11-11 DIAGNOSIS — IMO0001 Reserved for inherently not codable concepts without codable children: Secondary | ICD-10-CM

## 2011-11-11 DIAGNOSIS — D509 Iron deficiency anemia, unspecified: Secondary | ICD-10-CM

## 2011-11-11 DIAGNOSIS — D518 Other vitamin B12 deficiency anemias: Secondary | ICD-10-CM

## 2011-11-11 DIAGNOSIS — D519 Vitamin B12 deficiency anemia, unspecified: Secondary | ICD-10-CM

## 2011-11-11 DIAGNOSIS — E039 Hypothyroidism, unspecified: Secondary | ICD-10-CM

## 2011-11-11 DIAGNOSIS — E291 Testicular hypofunction: Secondary | ICD-10-CM

## 2011-11-11 LAB — VITAMIN B12: Vitamin B-12: 235 pg/mL (ref 211–911)

## 2011-11-11 LAB — IBC PANEL
Saturation Ratios: 10.4 % — ABNORMAL LOW (ref 20.0–50.0)
Transferrin: 315.1 mg/dL (ref 212.0–360.0)

## 2011-11-11 LAB — CBC WITH DIFFERENTIAL/PLATELET
Basophils Relative: 0.5 % (ref 0.0–3.0)
Eosinophils Absolute: 0.3 10*3/uL (ref 0.0–0.7)
Eosinophils Relative: 4.8 % (ref 0.0–5.0)
Lymphocytes Relative: 19.4 % (ref 12.0–46.0)
MCHC: 32.2 g/dL (ref 30.0–36.0)
Monocytes Relative: 9.9 % (ref 3.0–12.0)
Neutrophils Relative %: 65.4 % (ref 43.0–77.0)
RBC: 5.02 Mil/uL (ref 4.22–5.81)
WBC: 5.8 10*3/uL (ref 4.5–10.5)

## 2011-11-11 LAB — COMPREHENSIVE METABOLIC PANEL
Albumin: 3.9 g/dL (ref 3.5–5.2)
BUN: 13 mg/dL (ref 6–23)
CO2: 27 mEq/L (ref 19–32)
Calcium: 9.1 mg/dL (ref 8.4–10.5)
Chloride: 102 mEq/L (ref 96–112)
Creatinine, Ser: 0.8 mg/dL (ref 0.4–1.5)
GFR: 109.78 mL/min (ref >60.00)
Glucose, Bld: 155 mg/dL — ABNORMAL HIGH (ref 70–99)
Potassium: 4.1 mEq/L (ref 3.5–5.1)

## 2011-11-11 LAB — FERRITIN: Ferritin: 12.7 ng/mL — ABNORMAL LOW (ref 22.0–322.0)

## 2011-11-11 LAB — FOLATE: Folate: 13.9 ng/mL (ref >5.9)

## 2011-11-11 MED ORDER — CYANOCOBALAMIN 500 MCG/0.1ML NA SOLN: 0.1000 mL | NASAL | Status: DC

## 2011-11-11 NOTE — Progress Notes (Signed)
Subjective:    Patient ID: Adam Wilson, male    DOB: 1966-01-21, 46 y.o.   MRN: [id number]  Thyroid Problem Presents for follow-up visit. Symptoms include fatigue and weight gain. Patient reports no anxiety, cold intolerance, constipation, depressed mood, diaphoresis, diarrhea, dry skin, hair loss, heat intolerance, hoarse voice, leg swelling, nail problem, palpitations, tremors, visual change or weight loss.  Erectile Dysfunction This is a chronic problem. The current episode started more than 1 year ago. The problem has been gradually worsening since onset. The nature of his difficulty is achieving erection, maintaining erection and penetration. Non-physiologic factors contributing to erectile dysfunction are a decreased libido. He reports no anxiety or performance anxiety. He reports his erection duration to be less than 1 minute. Irritative symptoms do not include frequency, nocturia or urgency. Obstructive symptoms do not include dribbling, incomplete emptying, an intermittent stream, a slower stream, straining or a weak stream. Pertinent negatives include no chills, dysuria, genital pain, hematuria, hesitancy or inability to urinate. The symptoms are aggravated by stress. Past treatments include sildenafil. The treatment provided no relief. He has been using treatment for 1 to 6 months. He has had no adverse reactions caused by medications. Risk factors include colon surgery and diabetes mellitus.      Review of Systems  Constitutional: Positive for weight gain and fatigue. Negative for fever, chills, weight loss, diaphoresis, activity change, appetite change and unexpected weight change.  HENT: Negative.  Negative for hoarse voice.   Eyes: Negative.   Respiratory: Negative for cough, chest tightness, shortness of breath, wheezing and stridor.   Cardiovascular: Negative for chest pain, palpitations and leg swelling.  Gastrointestinal: Negative for nausea, vomiting, abdominal pain, diarrhea,  constipation, blood in stool, abdominal distention and anal bleeding.  Genitourinary: Positive for decreased libido. Negative for dysuria, hesitancy, urgency, frequency, hematuria, incomplete emptying and nocturia.  Musculoskeletal: Negative for myalgias, back pain, joint swelling, arthralgias and gait problem.  Skin: Negative for color change, pallor, rash and wound.  Neurological: Negative.  Negative for tremors.  Hematological: Negative for cold intolerance, heat intolerance and adenopathy. Does not bruise/bleed easily.  Psychiatric/Behavioral: Negative.        Objective:   Physical Exam  Vitals reviewed. Constitutional: He is oriented to person, place, and time. He appears well-developed and well-nourished. No distress.  HENT:  Head: Normocephalic and atraumatic.  Mouth/Throat: Oropharynx is clear and moist. No oropharyngeal exudate.  Eyes: Conjunctivae normal are normal. Right eye exhibits no discharge. Left eye exhibits no discharge. No scleral icterus.  Neck: Normal range of motion. Neck supple. No JVD present. No tracheal deviation present. No thyromegaly present.  Cardiovascular: Normal rate, regular rhythm, normal heart sounds and intact distal pulses.  Exam reveals no gallop and no friction rub.   No murmur heard. Pulmonary/Chest: Effort normal and breath sounds normal. No stridor. No respiratory distress. He has no wheezes. He has no rales. He exhibits no tenderness.  Abdominal: Soft. Bowel sounds are normal. He exhibits no distension and no mass. There is no tenderness. There is no rebound and no guarding.  Musculoskeletal: Normal range of motion. He exhibits no edema and no tenderness.  Lymphadenopathy:    He has no cervical adenopathy.  Neurological: He is oriented to person, place, and time.  Skin: Skin is warm and dry. No rash noted. He is not diaphoretic. No erythema. No pallor.  Psychiatric: He has a normal mood and affect. His behavior is normal. Judgment and thought  content normal.      Lab  Results  Component Value Date   WBC 6.4 06/17/2011   HGB 12.0* 06/17/2011   HCT 36.8* 06/17/2011   PLT 199.0 06/17/2011   GLUCOSE 116* 06/17/2011   CHOL 195 06/17/2011   TRIG 189.0* 06/17/2011   HDL 29.10* 06/17/2011   LDLDIRECT 169.4 08/27/2010   LDLCALC 128* 06/17/2011   ALT 28 06/17/2011   AST 24 06/17/2011   NA 139 06/17/2011   K 4.3 06/17/2011   CL 107 06/17/2011   CREATININE 0.9 06/17/2011   BUN 14 06/17/2011   CO2 24 06/17/2011   TSH 8.86* 06/17/2011   HGBA1C 6.5 06/17/2011      Assessment & Plan:

## 2011-11-11 NOTE — Assessment & Plan Note (Signed)
He has not been able to obtain B12 injection so I have asked him to start B12 NS, I will check his CBC and B12 level today

## 2011-11-11 NOTE — Assessment & Plan Note (Signed)
I will check his TSH and will adjust his dose if needed 

## 2011-11-11 NOTE — Assessment & Plan Note (Signed)
CBC and iron level today.

## 2011-11-11 NOTE — Assessment & Plan Note (Signed)
He did not respond much to viagra so I will check his testosterone level and PSA today to see if he has any additional risk factors for ED

## 2011-11-11 NOTE — Patient Instructions (Signed)

## 2011-11-11 NOTE — Assessment & Plan Note (Signed)
I will monitor his A1C today and will start meds for DM II if needed

## 2011-11-12 LAB — TESTOSTERONE, FREE, TOTAL, SHBG
Sex Hormone Binding: 21 nmol/L (ref 13–71)
Testosterone, Free: 55.5 pg/mL (ref 47.0–244.0)

## 2011-11-13 ENCOUNTER — Encounter: Payer: Self-pay | Admitting: Internal Medicine

## 2011-11-13 DIAGNOSIS — E291 Testicular hypofunction: Secondary | ICD-10-CM | POA: Insufficient documentation

## 2011-11-13 MED ORDER — TESTOSTERONE 20.25 MG/ACT (1.62%) TD GEL: 2.0000 | Freq: Every day | TRANSDERMAL | Status: DC

## 2011-11-13 MED ORDER — LEVOTHYROXINE SODIUM 150 MCG PO TABS: 150.0000 ug | ORAL_TABLET | Freq: Every day | ORAL | Status: DC

## 2011-11-14 NOTE — Assessment & Plan Note (Signed)
Restoring his T level should help the viagra be more effective

## 2011-11-14 NOTE — Assessment & Plan Note (Signed)
His T is low so I have offered androgel to him

## 2011-11-14 NOTE — Progress Notes (Signed)
Subjective:    Patient ID: Adam Wilson, male    DOB: 12-29-1965, 46 y.o.   MRN: [id number]  Anemia Presents for follow-up visit. Symptoms include malaise/fatigue. There has been no abdominal pain, anorexia, bruising/bleeding easily, confusion, fever, leg swelling, light-headedness, pallor, palpitations, paresthesias, pica or weight loss. Signs of blood loss that are not present include hematemesis, hematochezia and melena. There are no compliance problems.   Diabetes Pertinent negatives for hypoglycemia include no confusion or pallor. Associated symptoms include fatigue. Pertinent negatives for diabetes include no chest pain and no weight loss.  Erectile Dysfunction This is a chronic problem. The current episode started more than 1 year ago. The problem has been gradually worsening since onset. The nature of his difficulty is achieving erection, maintaining erection and penetration. Non-physiologic factors contributing to erectile dysfunction are a decreased libido and performance anxiety. He reports no anxiety. Irritative symptoms do not include frequency, nocturia or urgency. Obstructive symptoms do not include dribbling, incomplete emptying, an intermittent stream, a slower stream, straining or a weak stream. Pertinent negatives include no chills, dysuria, genital pain, hematuria, hesitancy or inability to urinate. Nothing aggravates the symptoms. Past treatments include sildenafil. The treatment provided mild relief. He has been using treatment for 6 to 12 months. He has had no adverse reactions caused by medications. Risk factors include diabetes mellitus and colon surgery.      Review of Systems  Constitutional: Positive for malaise/fatigue, fatigue and unexpected weight change (some weight gain). Negative for fever, chills, weight loss, diaphoresis, activity change and appetite change.  HENT: Negative.   Eyes: Negative.   Respiratory: Negative for cough, chest tightness, shortness of  breath, wheezing and stridor.   Cardiovascular: Negative for chest pain, palpitations and leg swelling.  Gastrointestinal: Negative for nausea, vomiting, abdominal pain, diarrhea, constipation, blood in stool, melena, hematochezia, anal bleeding, anorexia and hematemesis.  Genitourinary: Positive for decreased libido. Negative for dysuria, hesitancy, urgency, frequency, hematuria, incomplete emptying and nocturia.  Musculoskeletal: Negative.   Skin: Negative.  Negative for pallor.  Neurological: Negative.  Negative for light-headedness and paresthesias.  Hematological: Negative for adenopathy. Does not bruise/bleed easily.  Psychiatric/Behavioral: Negative.  Negative for confusion.       Objective:   Physical Exam  Vitals reviewed. Constitutional: He is oriented to person, place, and time. He appears well-developed and well-nourished. No distress.  HENT:  Head: Normocephalic and atraumatic.  Mouth/Throat: Oropharynx is clear and moist. No oropharyngeal exudate.  Eyes: Conjunctivae normal are normal. Right eye exhibits no discharge. Left eye exhibits no discharge. No scleral icterus.  Neck: Normal range of motion. Neck supple. No JVD present. No tracheal deviation present. No thyromegaly present.  Cardiovascular: Normal rate, regular rhythm and intact distal pulses.  Exam reveals no gallop and no friction rub.   No murmur heard. Pulmonary/Chest: Effort normal and breath sounds normal. No stridor. No respiratory distress. He has no wheezes. He has no rales. He exhibits no tenderness.  Abdominal: Soft. Bowel sounds are normal. He exhibits no distension and no mass. There is no tenderness. There is no rebound and no guarding.  Musculoskeletal: Normal range of motion. He exhibits no edema and no tenderness.  Lymphadenopathy:    He has no cervical adenopathy.  Neurological: He is oriented to person, place, and time.  Skin: Skin is warm and dry. No rash noted. He is not diaphoretic. No  erythema. No pallor.  Psychiatric: He has a normal mood and affect. His behavior is normal. Judgment and thought content normal.  Lab Results  Component Value Date   WBC 5.8 11/11/2011   HGB 12.2* 11/11/2011   HCT 37.9* 11/11/2011   PLT 178.0 11/11/2011   GLUCOSE 155* 11/11/2011   CHOL 195 06/17/2011   TRIG 189.0* 06/17/2011   HDL 29.10* 06/17/2011   LDLDIRECT 169.4 08/27/2010   LDLCALC 128* 06/17/2011   ALT 63* 11/11/2011   AST 47* 11/11/2011   NA 136 11/11/2011   K 4.1 11/11/2011   CL 102 11/11/2011   CREATININE 0.8 11/11/2011   BUN 13 11/11/2011   CO2 27 11/11/2011   TSH 9.61* 11/11/2011   PSA 0.00 Repeated and verified X2.* 11/11/2011   HGBA1C 7.6* 11/11/2011       Assessment & Plan:

## 2011-11-14 NOTE — Assessment & Plan Note (Signed)
His TSH is a little high so I have increased the dose of his synthroid 

## 2011-11-14 NOTE — Assessment & Plan Note (Signed)
I will check his A1c and will monitor his renal function

## 2011-11-14 NOTE — Assessment & Plan Note (Signed)
He has not been able to get B12 inj from his local pharmacy so I switched him to B12 NS

## 2011-11-14 NOTE — Assessment & Plan Note (Signed)
CBC and vitamin levels today 

## 2012-03-06 ENCOUNTER — Telehealth: Payer: Self-pay | Admitting: Internal Medicine

## 2012-03-06 DIAGNOSIS — D519 Vitamin B12 deficiency anemia, unspecified: Secondary | ICD-10-CM

## 2012-03-06 DIAGNOSIS — E291 Testicular hypofunction: Secondary | ICD-10-CM

## 2012-03-06 MED ORDER — LEVOTHYROXINE SODIUM 150 MCG PO TABS: 150.0000 ug | ORAL_TABLET | Freq: Every day | ORAL | Status: DC

## 2012-03-06 MED ORDER — CYANOCOBALAMIN 500 MCG/0.1ML NA SOLN: 0.1000 mL | NASAL | Status: DC

## 2012-03-06 NOTE — Telephone Encounter (Signed)
Patients wife left message on triage requesting a 3 month supply of Levothyroxine and B12 to be called in to the [facility] in [place], [place]

## 2012-03-06 NOTE — Telephone Encounter (Signed)
Rx's  sent to [facility] in [place], pt's spouse informed.

## 2012-05-04 ENCOUNTER — Ambulatory Visit: Payer: 59 | Admitting: Nurse Practitioner

## 2012-05-04 ENCOUNTER — Other Ambulatory Visit: Payer: 59 | Admitting: Lab

## 2012-09-20 ENCOUNTER — Other Ambulatory Visit: Payer: Self-pay | Admitting: Internal Medicine

## 2012-09-21 ENCOUNTER — Other Ambulatory Visit: Payer: Self-pay | Admitting: *Deleted

## 2012-09-21 DIAGNOSIS — E291 Testicular hypofunction: Secondary | ICD-10-CM

## 2012-09-21 MED ORDER — LEVOTHYROXINE SODIUM 150 MCG PO TABS: 150.0000 ug | ORAL_TABLET | Freq: Every day | ORAL | Status: DC

## 2012-09-23 ENCOUNTER — Ambulatory Visit (INDEPENDENT_AMBULATORY_CARE_PROVIDER_SITE_OTHER): Payer: BC Managed Care – PPO | Admitting: Internal Medicine

## 2012-09-23 ENCOUNTER — Ambulatory Visit (INDEPENDENT_AMBULATORY_CARE_PROVIDER_SITE_OTHER): Payer: BC Managed Care – PPO

## 2012-09-23 ENCOUNTER — Encounter: Payer: Self-pay | Admitting: Internal Medicine

## 2012-09-23 VITALS — BP 114/82 | HR 80 | Temp 98.4°F | Resp 16 | Wt 299.0 lb

## 2012-09-23 DIAGNOSIS — D519 Vitamin B12 deficiency anemia, unspecified: Secondary | ICD-10-CM

## 2012-09-23 DIAGNOSIS — Z23 Encounter for immunization: Secondary | ICD-10-CM

## 2012-09-23 DIAGNOSIS — E039 Hypothyroidism, unspecified: Secondary | ICD-10-CM

## 2012-09-23 DIAGNOSIS — R7401 Elevation of levels of liver transaminase levels: Secondary | ICD-10-CM

## 2012-09-23 DIAGNOSIS — E785 Hyperlipidemia, unspecified: Secondary | ICD-10-CM

## 2012-09-23 DIAGNOSIS — IMO0001 Reserved for inherently not codable concepts without codable children: Secondary | ICD-10-CM

## 2012-09-23 DIAGNOSIS — D518 Other vitamin B12 deficiency anemias: Secondary | ICD-10-CM

## 2012-09-23 DIAGNOSIS — D509 Iron deficiency anemia, unspecified: Secondary | ICD-10-CM

## 2012-09-23 LAB — GLUCOSE, POCT (MANUAL RESULT ENTRY): POC Glucose: 174 mg/dl — AB (ref 70–99)

## 2012-09-23 LAB — FERRITIN: Ferritin: 10 ng/mL — ABNORMAL LOW (ref 22.0–322.0)

## 2012-09-23 LAB — COMPREHENSIVE METABOLIC PANEL
ALT: 32 U/L (ref 0–53)
AST: 28 U/L (ref 0–37)
Albumin: 4.2 g/dL (ref 3.5–5.2)
BUN: 14 mg/dL (ref 6–23)
CO2: 27 mEq/L (ref 19–32)
Calcium: 9 mg/dL (ref 8.4–10.5)
Chloride: 103 mEq/L (ref 96–112)
GFR: 103.39 mL/min (ref >60.00)
Potassium: 3.7 mEq/L (ref 3.5–5.1)

## 2012-09-23 LAB — LIPID PANEL
HDL: 26.1 mg/dL — ABNORMAL LOW (ref >39.00)
Triglycerides: 200 mg/dL — ABNORMAL HIGH (ref 0.0–149.0)

## 2012-09-23 LAB — TSH: TSH: 6.63 u[IU]/mL — ABNORMAL HIGH (ref 0.35–5.50)

## 2012-09-23 LAB — HEPATITIS C ANTIBODY: HCV Ab: NEGATIVE

## 2012-09-23 LAB — CBC WITH DIFFERENTIAL/PLATELET
Basophils Relative: 0.6 % (ref 0.0–3.0)
Eosinophils Absolute: 0.4 10*3/uL (ref 0.0–0.7)
Hemoglobin: 12.1 g/dL — ABNORMAL LOW (ref 13.0–17.0)
Lymphocytes Relative: 15.2 % (ref 12.0–46.0)
MCHC: 32.5 g/dL (ref 30.0–36.0)
Monocytes Relative: 13.4 % — ABNORMAL HIGH (ref 3.0–12.0)
Neutro Abs: 3.7 10*3/uL (ref 1.4–7.7)
Neutrophils Relative %: 63.3 % (ref 43.0–77.0)
RBC: 4.93 Mil/uL (ref 4.22–5.81)
WBC: 5.9 10*3/uL (ref 4.5–10.5)

## 2012-09-23 LAB — URINALYSIS, ROUTINE W REFLEX MICROSCOPIC
Bilirubin Urine: NEGATIVE
Ketones, ur: NEGATIVE
Leukocytes, UA: NEGATIVE
Total Protein, Urine: 100
pH: 6 (ref 5.0–8.0)

## 2012-09-23 LAB — IBC PANEL
Iron: 57 ug/dL (ref 42–165)
Saturation Ratios: 12.4 % — ABNORMAL LOW (ref 20.0–50.0)
Transferrin: 327.3 mg/dL (ref 212.0–360.0)

## 2012-09-23 LAB — VITAMIN B12: Vitamin B-12: 179 pg/mL — ABNORMAL LOW (ref 211–911)

## 2012-09-23 MED ORDER — ROSUVASTATIN CALCIUM 10 MG PO TABS: 10.0000 mg | ORAL_TABLET | Freq: Every day | ORAL | Status: DC

## 2012-09-23 MED ORDER — SITAGLIP PHOS-METFORMIN HCL ER 50-1000 MG PO TB24: 1.0000 | ORAL_TABLET | Freq: Every day | ORAL | Status: DC

## 2012-09-23 NOTE — Assessment & Plan Note (Signed)
I have asked him to start taking crestor

## 2012-09-23 NOTE — Progress Notes (Signed)
Subjective:    Patient ID: Adam Wilson, male    DOB: 12-17-1965, 47 y.o.   MRN: [id number]  Diabetes He presents for his follow-up diabetic visit. He has type 2 diabetes mellitus. His disease course has been stable. There are no hypoglycemic associated symptoms. Pertinent negatives for hypoglycemia include no dizziness, headaches or tremors. Associated symptoms include fatigue, polydipsia, polyphagia and polyuria. Pertinent negatives for diabetes include no blurred vision, no chest pain, no foot paresthesias, no foot ulcerations, no visual change, no weakness and no weight loss. There are no hypoglycemic complications. There are no diabetic complications. Current diabetic treatment includes diet. He is compliant with treatment most of the time. His weight is decreasing steadily. He is following a generally healthy diet. Meal planning includes avoidance of concentrated sweets. He has not had a previous visit with a dietician. He participates in exercise intermittently. There is no change in his home blood glucose trend. An ACE inhibitor/angiotensin II receptor blocker is not being taken. He does not see a podiatrist.Eye exam is not current.      Review of Systems  Constitutional: Positive for fatigue. Negative for fever, chills, weight loss, diaphoresis, activity change, appetite change and unexpected weight change.  HENT: Negative.   Eyes: Negative.  Negative for blurred vision.  Respiratory: Negative.  Negative for apnea, cough, choking, chest tightness, shortness of breath, wheezing and stridor.   Cardiovascular: Negative.  Negative for chest pain, palpitations and leg swelling.  Gastrointestinal: Negative.  Negative for nausea, vomiting, abdominal pain, diarrhea and constipation.  Endocrine: Positive for polydipsia, polyphagia and polyuria.  Musculoskeletal: Negative.   Skin: Negative.   Allergic/Immunologic: Negative.   Neurological: Negative.  Negative for dizziness, tremors, weakness,  light-headedness, numbness and headaches.  Hematological: Negative.  Negative for adenopathy. Does not bruise/bleed easily.  Psychiatric/Behavioral: Negative.        Objective:   Physical Exam  Vitals reviewed. Constitutional: He is oriented to person, place, and time. He appears well-developed and well-nourished. No distress.  HENT:  Head: Normocephalic and atraumatic.  Mouth/Throat: Oropharynx is clear and moist. No oropharyngeal exudate.  Eyes: Conjunctivae are normal. Right eye exhibits no discharge. Left eye exhibits no discharge. No scleral icterus.  Neck: Normal range of motion. Neck supple. No JVD present. No tracheal deviation present. No thyromegaly present.  Cardiovascular: Normal rate, regular rhythm, normal heart sounds and intact distal pulses.  Exam reveals no gallop and no friction rub.   No murmur heard. Pulmonary/Chest: Effort normal and breath sounds normal. No stridor. No respiratory distress. He has no wheezes. He has no rales. He exhibits no tenderness.  Abdominal: Soft. Bowel sounds are normal. He exhibits no distension and no mass. There is no tenderness. There is no rebound and no guarding.  Musculoskeletal: Normal range of motion. He exhibits no edema and no tenderness.  Lymphadenopathy:    He has no cervical adenopathy.  Neurological: He is oriented to person, place, and time.  Skin: Skin is warm and dry. No rash noted. He is not diaphoretic. No erythema. No pallor.  Psychiatric: He has a normal mood and affect. His behavior is normal. Judgment and thought content normal.     Lab Results  Component Value Date   WBC 5.8 11/11/2011   HGB 12.2* 11/11/2011   HCT 37.9* 11/11/2011   PLT 178.0 11/11/2011   GLUCOSE 155* 11/11/2011   CHOL 195 06/17/2011   TRIG 189.0* 06/17/2011   HDL 29.10* 06/17/2011   LDLDIRECT 169.4 08/27/2010   LDLCALC 128* 06/17/2011  ALT 63* 11/11/2011   AST 47* 11/11/2011   NA 136 11/11/2011   K 4.1 11/11/2011   CL 102 11/11/2011   CREATININE 0.8  11/11/2011   BUN 13 11/11/2011   CO2 27 11/11/2011   TSH 9.61* 11/11/2011   PSA 0.00 Repeated and verified X2.* 11/11/2011   HGBA1C 7.6* 11/11/2011       Assessment & Plan:

## 2012-09-23 NOTE — Assessment & Plan Note (Signed)
He is losing weight with lifestyle modifications

## 2012-09-23 NOTE — Assessment & Plan Note (Signed)
I have asked him to start treating this with [id number] Today he was referred for an eye exam I will recheck his A1C and will monitor his renal function

## 2012-09-23 NOTE — Assessment & Plan Note (Signed)
He has not been giving himself B12 for months, will check his level today and will advise him further

## 2012-09-23 NOTE — Patient Instructions (Signed)
Type 2 Diabetes Mellitus, Adult Type 2 diabetes mellitus, often simply referred to as type 2 diabetes, is a long-lasting (chronic) disease. In type 2 diabetes, the pancreas does not make enough insulin (a hormone), the cells are less responsive to the insulin that is made (insulin resistance), or both. Normally, insulin moves sugars from food into the tissue cells. The tissue cells use the sugars for energy. The lack of insulin or the lack of normal response to insulin causes excess sugars to build up in the blood instead of going into the tissue cells. As a result, high blood sugar (hyperglycemia) develops. The effect of high sugar (glucose) levels can cause many complications. Type 2 diabetes was also previously called adult-onset diabetes but it can occur at any age.  RISK FACTORS  A person is predisposed to developing type 2 diabetes if someone in the family has the disease and also has one or more of the following primary risk factors:  Overweight.  An inactive lifestyle.  A history of consistently eating high-calorie foods. Maintaining a normal weight and regular physical activity can reduce the chance of developing type 2 diabetes. SYMPTOMS  A person with type 2 diabetes may not show symptoms initially. The symptoms of type 2 diabetes appear slowly. The symptoms include:  Increased thirst (polydipsia).  Increased urination (polyuria).  Increased urination during the night (nocturia).  Weight loss. This weight loss may be rapid.  Frequent, recurring infections.  Tiredness (fatigue).  Weakness.  Vision changes, such as blurred vision.  Fruity smell to your breath.  Abdominal pain.  Nausea or vomiting.  Cuts or bruises which are slow to heal.  Tingling or numbness in the hands or feet. DIAGNOSIS Type 2 diabetes is frequently not diagnosed until complications of diabetes are present. Type 2 diabetes is diagnosed when symptoms or complications are present and when blood  glucose levels are increased. Your blood glucose level may be checked by one or more of the following blood tests:  A fasting blood glucose test. You will not be allowed to eat for at least 8 hours before a blood sample is taken.  A random blood glucose test. Your blood glucose is checked at any time of the day regardless of when you ate.  A hemoglobin A1c blood glucose test. A hemoglobin A1c test provides information about blood glucose control over the previous 3 months.  An oral glucose tolerance test (OGTT). Your blood glucose is measured after you have not eaten (fasted) for 2 hours and then after you drink a glucose-containing beverage. TREATMENT   You may need to take insulin or diabetes medicine daily to keep blood glucose levels in the desired range.  You will need to match insulin dosing with exercise and healthy food choices. The treatment goal is to maintain the before meal blood sugar (preprandial glucose) level at 70 130 mg/dL. HOME CARE INSTRUCTIONS   Have your hemoglobin A1c level checked twice a year.  Perform daily blood glucose monitoring as directed by your caregiver.  Monitor urine ketones when you are ill and as directed by your caregiver.  Take your diabetes medicine or insulin as directed by your caregiver to maintain your blood glucose levels in the desired range.  Never run out of diabetes medicine or insulin. It is needed every day.  Adjust insulin based on your intake of carbohydrates. Carbohydrates can raise blood glucose levels but need to be included in your diet. Carbohydrates provide vitamins, minerals, and fiber which are an essential part of   a healthy diet. Carbohydrates are found in fruits, vegetables, whole grains, dairy products, legumes, and foods containing added sugars.    Eat healthy foods. Alternate 3 meals with 3 snacks.  Lose weight if overweight.  Carry a medical alert card or wear your medical alert jewelry.  Carry a 15 gram  carbohydrate snack with you at all times to treat low blood glucose (hypoglycemia). Some examples of 15 gram carbohydrate snacks include:  Glucose tablets, 3 or 4   Glucose gel, 15 gram tube  Raisins, 2 tablespoons (24 grams)  Jelly beans, 6  Animal crackers, 8  Regular pop, 4 ounces (120 mL)  Gummy treats, 9  Recognize hypoglycemia. Hypoglycemia occurs with blood glucose levels of 70 mg/dL and below. The risk for hypoglycemia increases when fasting or skipping meals, during or after intense exercise, and during sleep. Hypoglycemia symptoms can include:  Tremors or shakes.  Decreased ability to concentrate.  Sweating.  Increased heart rate.  Headache.  Dry mouth.  Hunger.  Irritability.  Anxiety.  Restless sleep.  Altered speech or coordination.  Confusion.  Treat hypoglycemia promptly. If you are alert and able to safely swallow, follow the 15:15 rule:  Take 15 20 grams of rapid-acting glucose or carbohydrate. Rapid-acting options include glucose gel, glucose tablets, or 4 ounces (120 mL) of fruit juice, regular soda, or low fat milk.  Check your blood glucose level 15 minutes after taking the glucose.  Take 15 20 grams more of glucose if the repeat blood glucose level is still 70 mg/dL or below.  Eat a meal or snack within 1 hour once blood glucose levels return to normal.    Be alert to polyuria and polydipsia which are early signs of hyperglycemia. An early awareness of hyperglycemia allows for prompt treatment. Treat hyperglycemia as directed by your caregiver.  Engage in at least 150 minutes of moderate-intensity physical activity a week, spread over at least 3 days of the week or as directed by your caregiver. In addition, you should engage in resistance exercise at least 2 times a week or as directed by your caregiver.  Adjust your medicine and food intake as needed if you start a new exercise or sport.  Follow your sick day plan at any time you  are unable to eat or drink as usual.  Avoid tobacco use.  Limit alcohol intake to no more than 1 drink per day for nonpregnant women and 2 drinks per day for men. You should drink alcohol only when you are also eating food. Talk with your caregiver whether alcohol is safe for you. Tell your caregiver if you drink alcohol several times a week.  Follow up with your caregiver regularly.  Schedule an eye exam soon after the diagnosis of type 2 diabetes and then annually.  Perform daily skin and foot care. Examine your skin and feet daily for cuts, bruises, redness, nail problems, bleeding, blisters, or sores. A foot exam by a caregiver should be done annually.  Brush your teeth and gums at least twice a day and floss at least once a day. Follow up with your dentist regularly.  Share your diabetes management plan with your workplace or school.  Stay up-to-date with immunizations.  Learn to manage stress.  Obtain ongoing diabetes education and support as needed.  Participate in, or seek rehabilitation as needed to maintain or improve independence and quality of life. Request a physical or occupational therapy referral if you are having foot or hand numbness or difficulties with grooming,   dressing, eating, or physical activity. SEEK MEDICAL CARE IF:   You are unable to eat food or drink fluids for more than 6 hours.  You have nausea and vomiting for more than 6 hours.  Your blood glucose level is over 240 mg/dL.  There is a change in mental status.  You develop an additional serious illness.  You have diarrhea for more than 6 hours.  You have been sick or have had a fever for a couple of days and are not getting better.  You have pain during any physical activity.  SEEK IMMEDIATE MEDICAL CARE IF:  You have difficulty breathing.  You have moderate to large ketone levels. MAKE SURE YOU:  Understand these instructions.  Will watch your condition.  Will get help right away if  you are not doing well or get worse. Document Released: 01/28/2005 Document Revised: 10/23/2011 Document Reviewed: 08/27/2011 [employer] Patient Information 2014 [employer].  

## 2012-09-23 NOTE — Assessment & Plan Note (Signed)
This is probably fatty liver I will check his viral hepatitis panel today

## 2012-09-23 NOTE — Assessment & Plan Note (Signed)
I will recheck his CBC and his iron levels

## 2012-09-24 ENCOUNTER — Encounter: Payer: Self-pay | Admitting: Internal Medicine

## 2012-09-24 LAB — LDL CHOLESTEROL, DIRECT: Direct LDL: 143 mg/dL

## 2012-09-24 MED ORDER — SYNTHROID 175 MCG PO TABS: 175.0000 ug | ORAL_TABLET | Freq: Every day | ORAL | Status: DC

## 2012-09-24 NOTE — Addendum Note (Signed)
Addended by: Etta Grandchild on: 09/24/2012 12:13 PM   Modules accepted: Orders, Medications

## 2012-10-28 LAB — HM DIABETES EYE EXAM

## 2012-11-03 ENCOUNTER — Telehealth: Payer: Self-pay

## 2012-11-03 DIAGNOSIS — IMO0001 Reserved for inherently not codable concepts without codable children: Secondary | ICD-10-CM

## 2012-11-03 DIAGNOSIS — E785 Hyperlipidemia, unspecified: Secondary | ICD-10-CM

## 2012-11-03 MED ORDER — ATORVASTATIN CALCIUM 40 MG PO TABS: 40.0000 mg | ORAL_TABLET | Freq: Every day | ORAL | Status: DC

## 2012-11-03 NOTE — Telephone Encounter (Signed)
Patient notified per MD.

## 2012-11-03 NOTE — Telephone Encounter (Signed)
Received fax from pharmacy 1) Advisng that pt has a $144 copay (w/ coupon) for Crestor. Pt would like to request generic (lovastatin 10 mg or 20 mg $4/30tabs and $10/90tabs). 2) Insurance will not cover janumet xr w/o a PA, plan prefers metformin and pt would like generic. Please advise on both 1 and 2 Thanks

## 2012-11-03 NOTE — Telephone Encounter (Signed)
I have samples of janumet-xr and a co-pay card, there is no generic for this I do not prescribe lovastatin, will change to generic lipitor

## 2012-12-17 ENCOUNTER — Other Ambulatory Visit: Payer: Self-pay

## 2013-01-25 ENCOUNTER — Ambulatory Visit (INDEPENDENT_AMBULATORY_CARE_PROVIDER_SITE_OTHER): Payer: BC Managed Care – PPO | Admitting: Internal Medicine

## 2013-01-25 ENCOUNTER — Encounter: Payer: Self-pay | Admitting: Internal Medicine

## 2013-01-25 ENCOUNTER — Other Ambulatory Visit (INDEPENDENT_AMBULATORY_CARE_PROVIDER_SITE_OTHER): Payer: BC Managed Care – PPO

## 2013-01-25 VITALS — BP 138/88 | HR 57 | Temp 98.5°F | Resp 16 | Wt 299.0 lb

## 2013-01-25 DIAGNOSIS — D518 Other vitamin B12 deficiency anemias: Secondary | ICD-10-CM

## 2013-01-25 DIAGNOSIS — IMO0001 Reserved for inherently not codable concepts without codable children: Secondary | ICD-10-CM

## 2013-01-25 DIAGNOSIS — D519 Vitamin B12 deficiency anemia, unspecified: Secondary | ICD-10-CM

## 2013-01-25 DIAGNOSIS — D509 Iron deficiency anemia, unspecified: Secondary | ICD-10-CM

## 2013-01-25 DIAGNOSIS — E039 Hypothyroidism, unspecified: Secondary | ICD-10-CM

## 2013-01-25 DIAGNOSIS — E785 Hyperlipidemia, unspecified: Secondary | ICD-10-CM

## 2013-01-25 DIAGNOSIS — E291 Testicular hypofunction: Secondary | ICD-10-CM

## 2013-01-25 LAB — CBC WITH DIFFERENTIAL/PLATELET
Basophils Absolute: 0 10*3/uL (ref 0.0–0.1)
HCT: 35.8 % — ABNORMAL LOW (ref 39.0–52.0)
Hemoglobin: 11.6 g/dL — ABNORMAL LOW (ref 13.0–17.0)
Lymphs Abs: 1.1 10*3/uL (ref 0.7–4.0)
MCHC: 32.4 g/dL (ref 30.0–36.0)
MCV: 72.7 fl — ABNORMAL LOW (ref 78.0–100.0)
Monocytes Absolute: 0.7 10*3/uL (ref 0.1–1.0)
Monocytes Relative: 10.4 % (ref 3.0–12.0)
Neutro Abs: 4.5 10*3/uL (ref 1.4–7.7)
Platelets: 192 10*3/uL (ref 150.0–400.0)
RDW: 18.1 % — ABNORMAL HIGH (ref 11.5–14.6)

## 2013-01-25 LAB — HEMOGLOBIN A1C: Hgb A1c MFr Bld: 7.2 % — ABNORMAL HIGH (ref 4.6–6.5)

## 2013-01-25 LAB — COMPREHENSIVE METABOLIC PANEL
ALT: 29 U/L (ref 0–53)
AST: 22 U/L (ref 0–37)
Alkaline Phosphatase: 73 U/L (ref 39–117)
CO2: 25 mEq/L (ref 19–32)
Creatinine, Ser: 0.8 mg/dL (ref 0.4–1.5)
GFR: 109.23 mL/min (ref >60.00)
Sodium: 137 mEq/L (ref 135–145)
Total Bilirubin: 0.7 mg/dL (ref 0.3–1.2)
Total Protein: 7.3 g/dL (ref 6.0–8.3)

## 2013-01-25 MED ORDER — TESTOSTERONE 20.25 MG/ACT (1.62%) TD GEL: 2.0000 | Freq: Every day | TRANSDERMAL | Status: DC

## 2013-01-25 NOTE — Patient Instructions (Signed)
Type 2 Diabetes Mellitus, Adult Type 2 diabetes mellitus, often simply referred to as type 2 diabetes, is a long-lasting (chronic) disease. In type 2 diabetes, the pancreas does not make enough insulin (a hormone), the cells are less responsive to the insulin that is made (insulin resistance), or both. Normally, insulin moves sugars from food into the tissue cells. The tissue cells use the sugars for energy. The lack of insulin or the lack of normal response to insulin causes excess sugars to build up in the blood instead of going into the tissue cells. As a result, high blood sugar (hyperglycemia) develops. The effect of high sugar (glucose) levels can cause many complications. Type 2 diabetes was also previously called adult-onset diabetes but it can occur at any age.  RISK FACTORS  A person is predisposed to developing type 2 diabetes if someone in the family has the disease and also has one or more of the following primary risk factors:  Overweight.  An inactive lifestyle.  A history of consistently eating high-calorie foods. Maintaining a normal weight and regular physical activity can reduce the chance of developing type 2 diabetes. SYMPTOMS  A person with type 2 diabetes may not show symptoms initially. The symptoms of type 2 diabetes appear slowly. The symptoms include:  Increased thirst (polydipsia).  Increased urination (polyuria).  Increased urination during the night (nocturia).  Weight loss. This weight loss may be rapid.  Frequent, recurring infections.  Tiredness (fatigue).  Weakness.  Vision changes, such as blurred vision.  Fruity smell to your breath.  Abdominal pain.  Nausea or vomiting.  Cuts or bruises which are slow to heal.  Tingling or numbness in the hands or feet. DIAGNOSIS Type 2 diabetes is frequently not diagnosed until complications of diabetes are present. Type 2 diabetes is diagnosed when symptoms or complications are present and when blood  glucose levels are increased. Your blood glucose level may be checked by one or more of the following blood tests:  A fasting blood glucose test. You will not be allowed to eat for at least 8 hours before a blood sample is taken.  A random blood glucose test. Your blood glucose is checked at any time of the day regardless of when you ate.  A hemoglobin A1c blood glucose test. A hemoglobin A1c test provides information about blood glucose control over the previous 3 months.  An oral glucose tolerance test (OGTT). Your blood glucose is measured after you have not eaten (fasted) for 2 hours and then after you drink a glucose-containing beverage. TREATMENT   You may need to take insulin or diabetes medicine daily to keep blood glucose levels in the desired range.  You will need to match insulin dosing with exercise and healthy food choices. The treatment goal is to maintain the before meal blood sugar (preprandial glucose) level at 70 130 mg/dL. HOME CARE INSTRUCTIONS   Have your hemoglobin A1c level checked twice a year.  Perform daily blood glucose monitoring as directed by your caregiver.  Monitor urine ketones when you are ill and as directed by your caregiver.  Take your diabetes medicine or insulin as directed by your caregiver to maintain your blood glucose levels in the desired range.  Never run out of diabetes medicine or insulin. It is needed every day.  Adjust insulin based on your intake of carbohydrates. Carbohydrates can raise blood glucose levels but need to be included in your diet. Carbohydrates provide vitamins, minerals, and fiber which are an essential part of   a healthy diet. Carbohydrates are found in fruits, vegetables, whole grains, dairy products, legumes, and foods containing added sugars.    Eat healthy foods. Alternate 3 meals with 3 snacks.  Lose weight if overweight.  Carry a medical alert card or wear your medical alert jewelry.  Carry a 15 gram  carbohydrate snack with you at all times to treat low blood glucose (hypoglycemia). Some examples of 15 gram carbohydrate snacks include:  Glucose tablets, 3 or 4   Glucose gel, 15 gram tube  Raisins, 2 tablespoons (24 grams)  Jelly beans, 6  Animal crackers, 8  Regular pop, 4 ounces (120 mL)  Gummy treats, 9  Recognize hypoglycemia. Hypoglycemia occurs with blood glucose levels of 70 mg/dL and below. The risk for hypoglycemia increases when fasting or skipping meals, during or after intense exercise, and during sleep. Hypoglycemia symptoms can include:  Tremors or shakes.  Decreased ability to concentrate.  Sweating.  Increased heart rate.  Headache.  Dry mouth.  Hunger.  Irritability.  Anxiety.  Restless sleep.  Altered speech or coordination.  Confusion.  Treat hypoglycemia promptly. If you are alert and able to safely swallow, follow the 15:15 rule:  Take 15 20 grams of rapid-acting glucose or carbohydrate. Rapid-acting options include glucose gel, glucose tablets, or 4 ounces (120 mL) of fruit juice, regular soda, or low fat milk.  Check your blood glucose level 15 minutes after taking the glucose.  Take 15 20 grams more of glucose if the repeat blood glucose level is still 70 mg/dL or below.  Eat a meal or snack within 1 hour once blood glucose levels return to normal.    Be alert to polyuria and polydipsia which are early signs of hyperglycemia. An early awareness of hyperglycemia allows for prompt treatment. Treat hyperglycemia as directed by your caregiver.  Engage in at least 150 minutes of moderate-intensity physical activity a week, spread over at least 3 days of the week or as directed by your caregiver. In addition, you should engage in resistance exercise at least 2 times a week or as directed by your caregiver.  Adjust your medicine and food intake as needed if you start a new exercise or sport.  Follow your sick day plan at any time you  are unable to eat or drink as usual.  Avoid tobacco use.  Limit alcohol intake to no more than 1 drink per day for nonpregnant women and 2 drinks per day for men. You should drink alcohol only when you are also eating food. Talk with your caregiver whether alcohol is safe for you. Tell your caregiver if you drink alcohol several times a week.  Follow up with your caregiver regularly.  Schedule an eye exam soon after the diagnosis of type 2 diabetes and then annually.  Perform daily skin and foot care. Examine your skin and feet daily for cuts, bruises, redness, nail problems, bleeding, blisters, or sores. A foot exam by a caregiver should be done annually.  Brush your teeth and gums at least twice a day and floss at least once a day. Follow up with your dentist regularly.  Share your diabetes management plan with your workplace or school.  Stay up-to-date with immunizations.  Learn to manage stress.  Obtain ongoing diabetes education and support as needed.  Participate in, or seek rehabilitation as needed to maintain or improve independence and quality of life. Request a physical or occupational therapy referral if you are having foot or hand numbness or difficulties with grooming,   dressing, eating, or physical activity. SEEK MEDICAL CARE IF:   You are unable to eat food or drink fluids for more than 6 hours.  You have nausea and vomiting for more than 6 hours.  Your blood glucose level is over 240 mg/dL.  There is a change in mental status.  You develop an additional serious illness.  You have diarrhea for more than 6 hours.  You have been sick or have had a fever for a couple of days and are not getting better.  You have pain during any physical activity.  SEEK IMMEDIATE MEDICAL CARE IF:  You have difficulty breathing.  You have moderate to large ketone levels. MAKE SURE YOU:  Understand these instructions.  Will watch your condition.  Will get help right away if  you are not doing well or get worse. Document Released: 01/28/2005 Document Revised: 10/23/2011 Document Reviewed: 08/27/2011 [employer] Patient Information 2014 [employer].  

## 2013-01-25 NOTE — Assessment & Plan Note (Signed)
His TSH is in the normal range He will continue the same dose for now

## 2013-01-25 NOTE — Assessment & Plan Note (Signed)
His A1C has improved He will work on his lifestyle modifications and will cont the same meds for now He was referred for an annual eye exam

## 2013-01-25 NOTE — Assessment & Plan Note (Signed)
He has not been treating this but reports ED and low libido I have asked him to start androgel

## 2013-01-25 NOTE — Progress Notes (Signed)
Pre visit review using our clinic review tool, if applicable. No additional management support is needed unless otherwise documented below in the visit note. 

## 2013-01-25 NOTE — Progress Notes (Signed)
Subjective:    Patient ID: Adam Wilson, male    DOB: [birth year], 47 y.o.   MRN: [id number]  Diabetes He presents for his follow-up diabetic visit. He has type 2 diabetes mellitus. His disease course has been fluctuating. There are no hypoglycemic associated symptoms. Associated symptoms include polyuria. Pertinent negatives for diabetes include no blurred vision, no chest pain, no fatigue, no foot paresthesias, no foot ulcerations, no polydipsia, no polyphagia, no visual change, no weakness and no weight loss. There are no hypoglycemic complications. Symptoms are stable. Diabetic complications include peripheral neuropathy. Current diabetic treatment includes oral agent (dual therapy). He is compliant with treatment most of the time. His weight is stable. He is following a generally healthy diet. Meal planning includes avoidance of concentrated sweets. He has not had a previous visit with a dietician. He participates in exercise intermittently. There is no change in his home blood glucose trend. An ACE inhibitor/angiotensin II receptor blocker is not being taken. He does not see a podiatrist.Eye exam is not current.      Review of Systems  Constitutional: Negative.  Negative for fever, chills, weight loss, diaphoresis, activity change, appetite change, fatigue and unexpected weight change.  HENT: Negative.   Eyes: Negative.  Negative for blurred vision.  Respiratory: Negative.  Negative for cough, choking, chest tightness, shortness of breath, wheezing and stridor.   Cardiovascular: Negative.  Negative for chest pain, palpitations and leg swelling.  Gastrointestinal: Negative.  Negative for nausea, vomiting, abdominal pain, diarrhea, constipation and blood in stool.  Endocrine: Positive for polyuria. Negative for polydipsia and polyphagia.  Genitourinary: Negative.   Musculoskeletal: Negative.  Negative for arthralgias, back pain, gait problem, joint swelling, myalgias, neck pain and neck  stiffness.  Skin: Negative.   Allergic/Immunologic: Negative.   Neurological: Negative.  Negative for weakness.  Hematological: Negative.  Negative for adenopathy. Does not bruise/bleed easily.  Psychiatric/Behavioral: Negative.        Objective:   Physical Exam  Vitals reviewed. Constitutional: He is oriented to person, place, and time. He appears well-developed and well-nourished. No distress.  HENT:  Head: Normocephalic and atraumatic.  Mouth/Throat: Oropharynx is clear and moist. No oropharyngeal exudate.  Eyes: Conjunctivae are normal. Right eye exhibits no discharge. Left eye exhibits no discharge. No scleral icterus.  Neck: Normal range of motion. Neck supple. No JVD present. No tracheal deviation present. No thyromegaly present.  Cardiovascular: Normal rate, regular rhythm, normal heart sounds and intact distal pulses.  Exam reveals no gallop and no friction rub.   No murmur heard. Pulmonary/Chest: Effort normal and breath sounds normal. No stridor. No respiratory distress. He has no wheezes. He has no rales. He exhibits no tenderness.  Abdominal: Soft. Bowel sounds are normal. He exhibits no distension and no mass. There is no tenderness. There is no rebound and no guarding.  Musculoskeletal: Normal range of motion. He exhibits no edema and no tenderness.  Lymphadenopathy:    He has no cervical adenopathy.  Neurological: He is oriented to person, place, and time.  Skin: Skin is warm and dry. No rash noted. He is not diaphoretic. No erythema. No pallor.  Psychiatric: He has a normal mood and affect. His behavior is normal. Judgment and thought content normal.     Lab Results  Component Value Date   WBC 5.9 09/23/2012   HGB 12.1* 09/23/2012   HCT 37.3* 09/23/2012   PLT 190.0 09/23/2012   GLUCOSE 150* 09/23/2012   CHOL 204* 09/23/2012   TRIG 200.0* 09/23/2012  HDL 26.10* 09/23/2012   LDLDIRECT 143.0 09/23/2012   LDLCALC 128* 06/17/2011   ALT 32 09/23/2012   AST 28 09/23/2012    NA 136 09/23/2012   K 3.7 09/23/2012   CL 103 09/23/2012   CREATININE 0.8 09/23/2012   BUN 14 09/23/2012   CO2 27 09/23/2012   TSH 6.63* 09/23/2012   PSA 0.00 Repeated and verified X2.* 11/11/2011   HGBA1C 8.0* 09/23/2012       Assessment & Plan:

## 2013-01-25 NOTE — Assessment & Plan Note (Signed)
His CBC is stable

## 2013-01-26 ENCOUNTER — Telehealth: Payer: Self-pay | Admitting: *Deleted

## 2013-01-26 ENCOUNTER — Telehealth: Payer: Self-pay

## 2013-01-26 DIAGNOSIS — E291 Testicular hypofunction: Secondary | ICD-10-CM

## 2013-01-26 MED ORDER — TESTOSTERONE CYPIONATE 200 MG/ML IM SOLN: 200.0000 mg | INTRAMUSCULAR | Status: DC

## 2013-01-26 NOTE — Telephone Encounter (Signed)
Received fax from pharmacy stating that insurance will not cover Angrodel pump  w/o a prior auth. Need to  call [facility]   to proceed with request. PA approved 01/25/13 through 01/26/14, pharmacy notified via fax.

## 2013-01-26 NOTE — Telephone Encounter (Signed)
See Rx 

## 2013-01-26 NOTE — Telephone Encounter (Signed)
Patient called and stated that his insurance will not cover testosterone gel, but would pay for injections. He would like to know if this can be switched. Please advise. [id number]

## 2013-04-10 ENCOUNTER — Other Ambulatory Visit: Payer: Self-pay | Admitting: Internal Medicine

## 2013-05-24 ENCOUNTER — Ambulatory Visit: Payer: BC Managed Care – PPO | Admitting: Internal Medicine

## 2013-05-30 IMAGING — CT CT ABD-PELV W/ CM
2 of 6 series · 16 of 46 positions shown, 18 images · IV contrast (agent unspecified)
Comparison: 06/19/2009

CT CHEST

CLINICAL DATA: Rectal cancer diagnosed [DATE] with resection.
Ileostomy with reversal.  Prior chemotherapy.

CT CHEST, ABDOMEN AND PELVIS WITH CONTRAST
TECHNIQUE: Contiguous axial images of the chest abdomen and pelvis
were obtained after IV contrast administration.
Contrast: 125 ml Imnipaque-BCC

[Series 2: cap with st · axial · 0.98mm/px · z∈[-738,-58]mm · 13 of 154 slices shown, 15 images]
[im 9/154  soft-tissue]
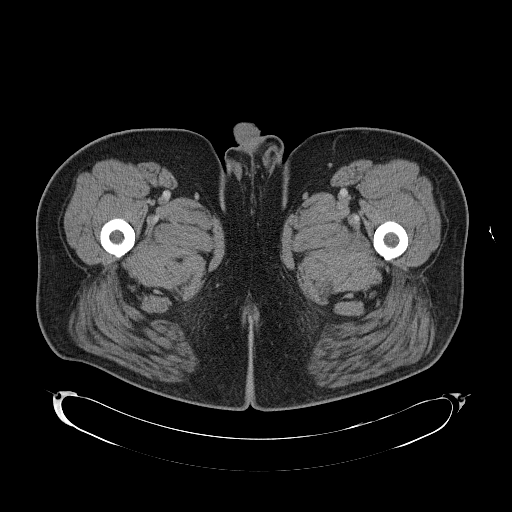
[im 9/154  bone]
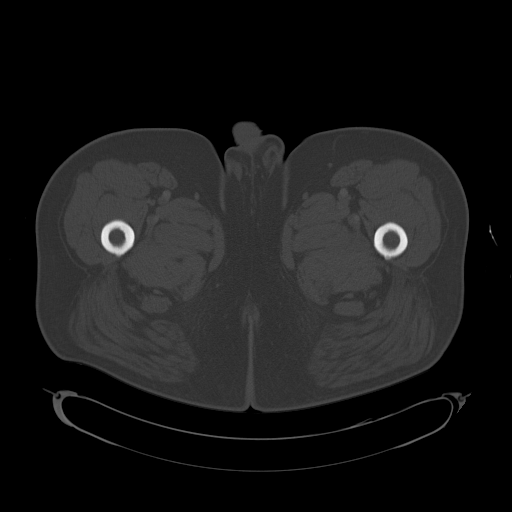
[im 18/154  soft-tissue]
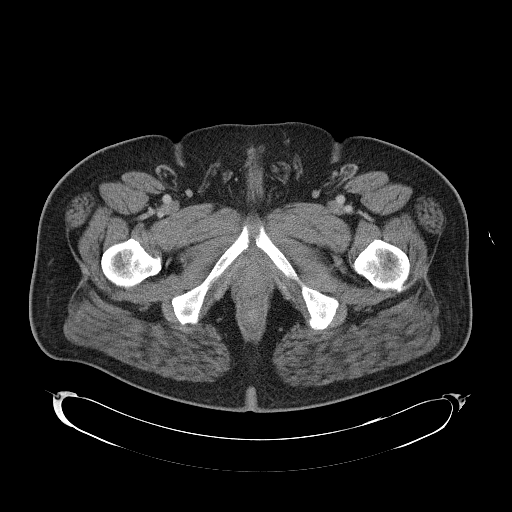
[im 35/154  soft-tissue]
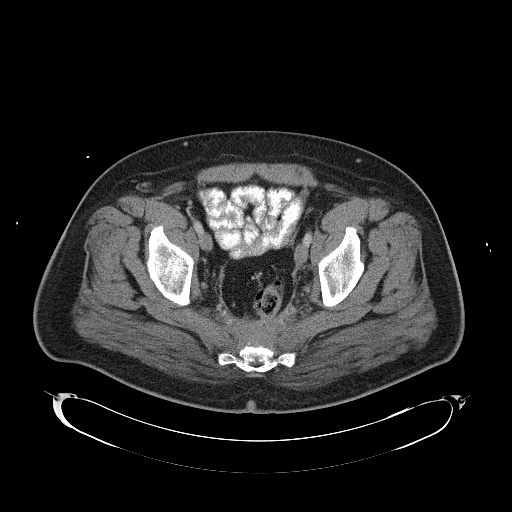
[im 43/154  soft-tissue]
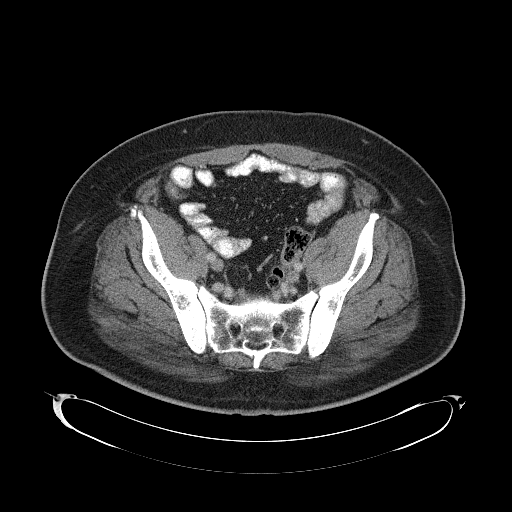
[im 52/154  soft-tissue]
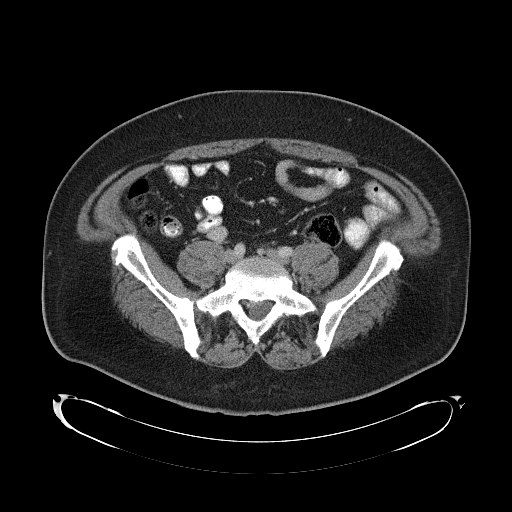
[im 69/154  soft-tissue]
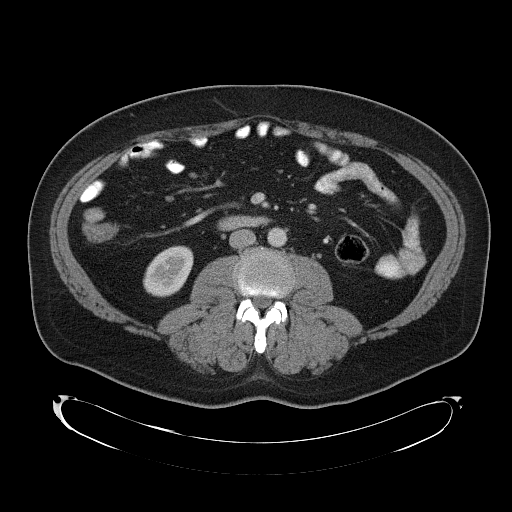
[im 77/154  soft-tissue]
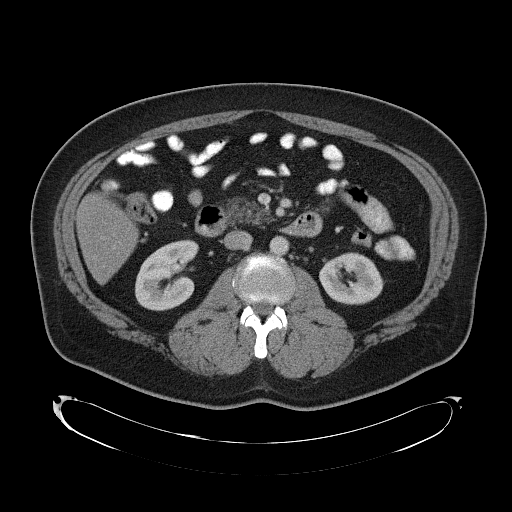
[im 86/154  soft-tissue]
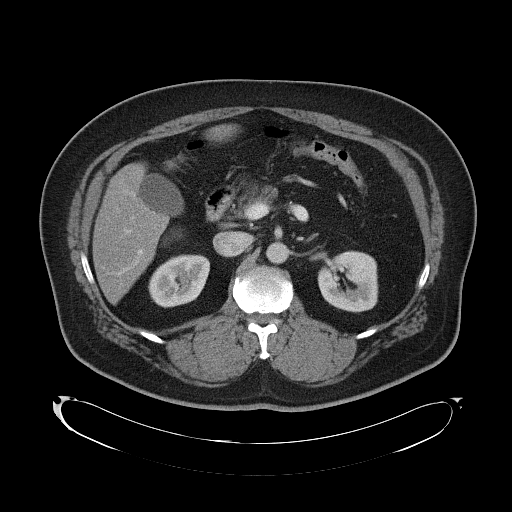
[im 103/154  soft-tissue]
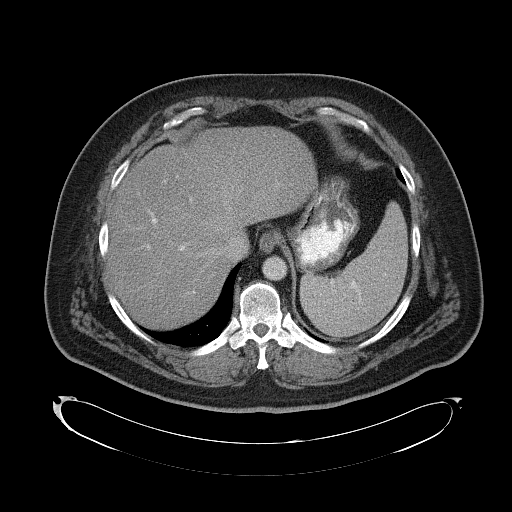
[im 103/154  bone]
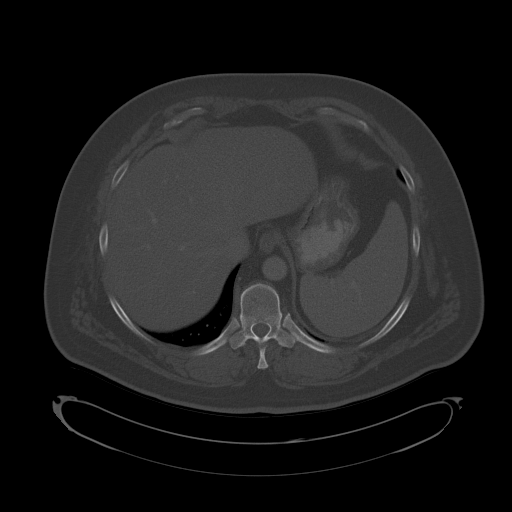
[im 111/154  soft-tissue]
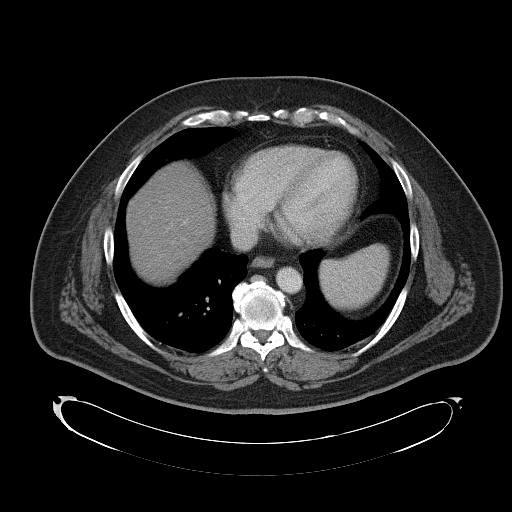
[im 120/154  soft-tissue]
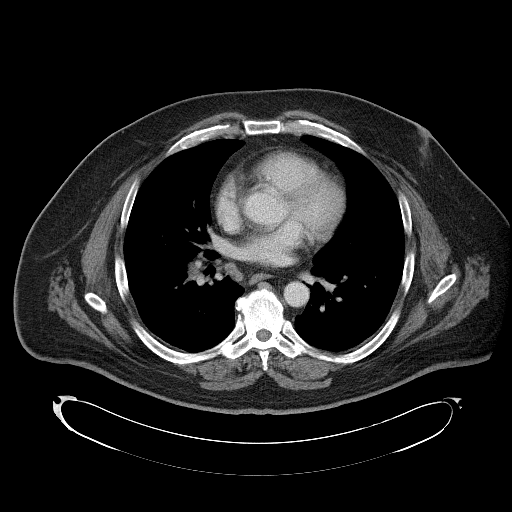
[im 137/154  soft-tissue]
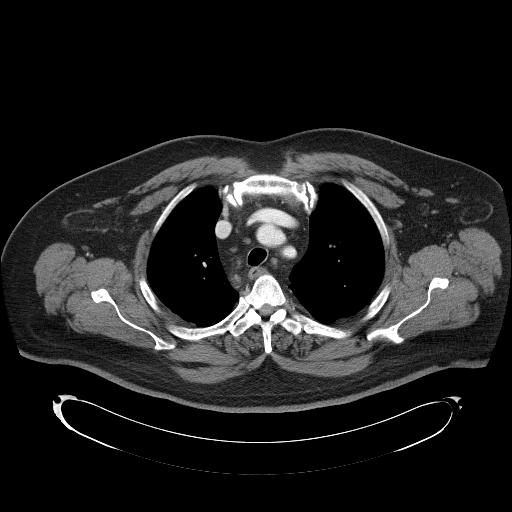
[im 145/154  soft-tissue]
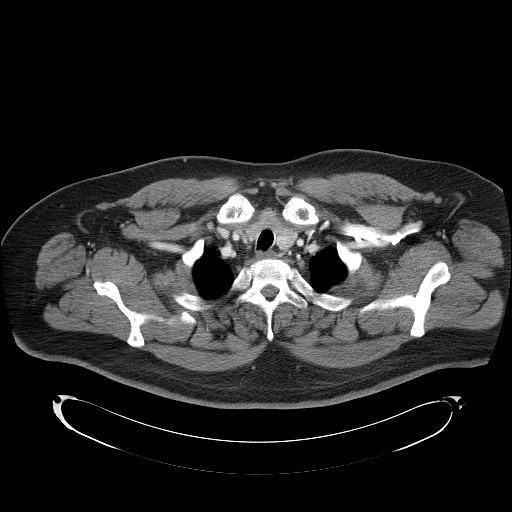

[Series 602: <mpr thick range> · coronal · 1.50mm/px · 3 of 111 slices shown]
[im 37/111  soft-tissue]
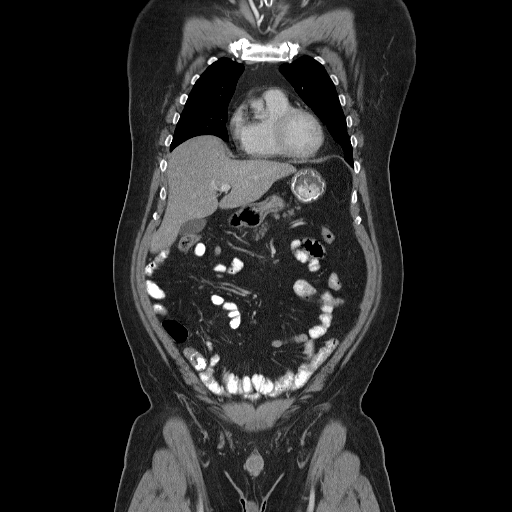
[im 49/111  soft-tissue]
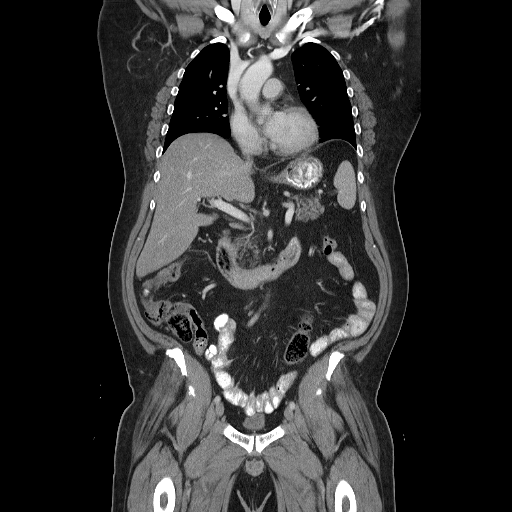
[im 62/111  soft-tissue]
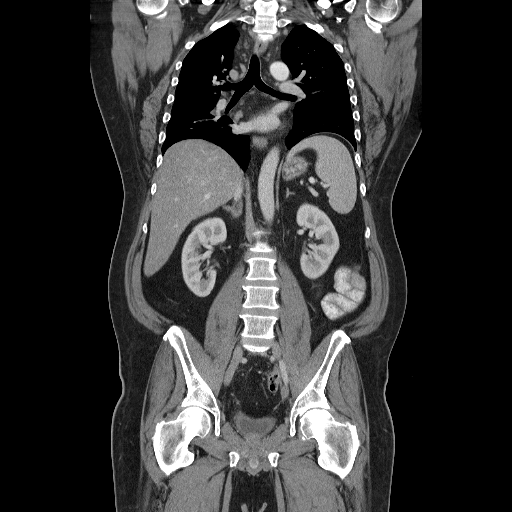

[16 of 46 positions shown; findings below may reference images not displayed]

FINDINGS: Lung windows demonstrate no new nodules or airspace
opacities. A right minor perifissural subpleural lymph node is
unchanged on image 32.

Soft tissue windows demonstrate normal heart size without
pericardial or pleural effusion.

Stable azygo-esophageal recess node at 1.2 cm on image 35.
Borderline right hilar adenopathy is unchanged.  1.9 cm on image
29.
IMPRESSION: 1. No acute process or evidence of metastatic disease in the chest.
2.  Similar prominent thoracic lymph nodes, likely reactive.

CT ABDOMEN AND PELVIS
FINDINGS: Mild hepatic steatosis.  No focal liver lesion.  A
splenule.  Normal stomach.  Partially fatty replaced pancreas.
Tiny gallstone.  Normal biliary tree, adrenal glands.  Bilateral
renal collecting system calculi without hydronephrosis.

Stable small retroperitoneal lymph nodes without adenopathy.

Surgical changes at the rectum.  Similar prerectal soft tissue
thickening.  Redemonstration of small foci of air within the
presacral space.  Example images 116 and 118 of series 2.

Normal terminal ileum. Normal small bowel without abdominal
ascites.

No pelvic sidewall adenopathy.  2 prominent nodes in the sigmoid
mesocolon are unchanged.  The larger measures 1.0 cm.

Normal urinary bladder and prostate.  No significant free fluid.

Right lower quadrant skin changes from prior ileostomy.  Bilateral
femoral head avascular necrosis.  Greater left than right.
Unchanged.  Similar radiation induced sclerosis within the bony
pelvis.  Chronic mild to moderate T9 compression deformity without
significant canal compromise.
IMPRESSION: 1. No acute process or evidence of metastatic disease in the
abdomen or pelvis.
2.  Surgical changes within the rectum with similar presacral soft
tissue density and gas.  The gas could be related to a side-to-side
anastomosis or be extraluminal.  Given its similar appearance over
1 year, this is of doubtful clinical significance.
3.  Prominent nodes in the sigmoid mesocolon are also stable and
therefore likely reactive.
4.  Hepatic steatosis.
5.  Radiation changes within the bony pelvis.  Bilateral femoral
head avascular necrosis is unchanged.
6.  Cholelithiasis.

## 2013-05-31 ENCOUNTER — Other Ambulatory Visit (INDEPENDENT_AMBULATORY_CARE_PROVIDER_SITE_OTHER): Payer: BC Managed Care – PPO

## 2013-05-31 ENCOUNTER — Encounter: Payer: Self-pay | Admitting: Internal Medicine

## 2013-05-31 ENCOUNTER — Ambulatory Visit (INDEPENDENT_AMBULATORY_CARE_PROVIDER_SITE_OTHER): Payer: BC Managed Care – PPO | Admitting: Internal Medicine

## 2013-05-31 VITALS — BP 120/78 | HR 64 | Temp 97.8°F | Resp 16 | Ht 73.0 in | Wt 287.5 lb

## 2013-05-31 DIAGNOSIS — D509 Iron deficiency anemia, unspecified: Secondary | ICD-10-CM

## 2013-05-31 DIAGNOSIS — E1165 Type 2 diabetes mellitus with hyperglycemia: Principal | ICD-10-CM

## 2013-05-31 DIAGNOSIS — E785 Hyperlipidemia, unspecified: Secondary | ICD-10-CM

## 2013-05-31 DIAGNOSIS — IMO0001 Reserved for inherently not codable concepts without codable children: Secondary | ICD-10-CM

## 2013-05-31 DIAGNOSIS — D518 Other vitamin B12 deficiency anemias: Secondary | ICD-10-CM

## 2013-05-31 DIAGNOSIS — D519 Vitamin B12 deficiency anemia, unspecified: Secondary | ICD-10-CM

## 2013-05-31 DIAGNOSIS — E039 Hypothyroidism, unspecified: Secondary | ICD-10-CM

## 2013-05-31 DIAGNOSIS — E291 Testicular hypofunction: Secondary | ICD-10-CM

## 2013-05-31 LAB — CBC WITH DIFFERENTIAL/PLATELET
BASOS PCT: 0.5 % (ref 0.0–3.0)
Basophils Absolute: 0 10*3/uL (ref 0.0–0.1)
EOS PCT: 3.6 % (ref 0.0–5.0)
Eosinophils Absolute: 0.2 10*3/uL (ref 0.0–0.7)
HCT: 37.7 % — ABNORMAL LOW (ref 39.0–52.0)
Hemoglobin: 12 g/dL — ABNORMAL LOW (ref 13.0–17.0)
Lymphocytes Relative: 17.3 % (ref 12.0–46.0)
Lymphs Abs: 1.2 10*3/uL (ref 0.7–4.0)
MCHC: 31.9 g/dL (ref 30.0–36.0)
MCV: 74.1 fl — ABNORMAL LOW (ref 78.0–100.0)
Monocytes Absolute: 0.8 10*3/uL (ref 0.1–1.0)
Monocytes Relative: 11.1 % (ref 3.0–12.0)
Neutro Abs: 4.6 10*3/uL (ref 1.4–7.7)
Neutrophils Relative %: 67.5 % (ref 43.0–77.0)
Platelets: 203 10*3/uL (ref 150.0–400.0)
RBC: 5.09 Mil/uL (ref 4.22–5.81)
RDW: 17.4 % — ABNORMAL HIGH (ref 11.5–14.6)
WBC: 6.8 10*3/uL (ref 4.5–10.5)

## 2013-05-31 LAB — BASIC METABOLIC PANEL
BUN: 13 mg/dL (ref 6–23)
CO2: 26 mEq/L (ref 19–32)
Calcium: 8.9 mg/dL (ref 8.4–10.5)
Chloride: 106 mEq/L (ref 96–112)
Creatinine, Ser: 0.8 mg/dL (ref 0.4–1.5)
GFR: 112.3 mL/min (ref >60.00)
GLUCOSE: 116 mg/dL — AB (ref 70–99)
POTASSIUM: 4.2 meq/L (ref 3.5–5.1)
SODIUM: 139 meq/L (ref 135–145)

## 2013-05-31 LAB — LIPID PANEL
Cholesterol: 127 mg/dL (ref 0–200)
HDL: 27.3 mg/dL — ABNORMAL LOW (ref >39.00)
LDL Cholesterol: 74 mg/dL (ref 0–99)
Total CHOL/HDL Ratio: 5
Triglycerides: 131 mg/dL (ref 0.0–149.0)
VLDL: 26.2 mg/dL (ref 0.0–40.0)

## 2013-05-31 LAB — HEMOGLOBIN A1C: Hgb A1c MFr Bld: 6.6 % — ABNORMAL HIGH (ref 4.6–6.5)

## 2013-05-31 LAB — TSH: TSH: 0.3 u[IU]/mL — AB (ref 0.35–5.50)

## 2013-05-31 MED ORDER — LEVOTHYROXINE SODIUM 150 MCG PO TABS: 150.0000 ug | ORAL_TABLET | Freq: Every day | ORAL | Status: DC

## 2013-05-31 MED ORDER — CYANOCOBALAMIN 2000 MCG PO TABS: 2000.0000 ug | ORAL_TABLET | Freq: Every day | ORAL | Status: DC

## 2013-05-31 NOTE — Progress Notes (Signed)
Subjective:    Patient ID: Adam Wilson, male    DOB: 10-Feb-1966, 48 y.o.   MRN: [id number]  Diabetes He presents for his follow-up diabetic visit. He has type 2 diabetes mellitus. His disease course has been improving. Pertinent negatives for hypoglycemia include no dizziness or tremors. Pertinent negatives for diabetes include no blurred vision, no chest pain, no fatigue, no foot paresthesias, no foot ulcerations, no polydipsia, no polyphagia, no polyuria, no visual change, no weakness and no weight loss. Symptoms are stable. Current diabetic treatment includes oral agent (dual therapy). He is compliant with treatment all of the time. His weight is stable. He is following a generally healthy diet. Meal planning includes avoidance of concentrated sweets. He participates in exercise intermittently. There is no change in his home blood glucose trend. He does not see a podiatrist.Eye exam is current.      Review of Systems  Constitutional: Negative.  Negative for fever, chills, weight loss, diaphoresis, appetite change and fatigue.  HENT: Negative.   Eyes: Negative.  Negative for blurred vision.  Respiratory: Negative.  Negative for cough, choking, chest tightness, shortness of breath, wheezing and stridor.   Cardiovascular: Negative.  Negative for chest pain, palpitations and leg swelling.  Gastrointestinal: Negative.  Negative for nausea, vomiting, abdominal pain, diarrhea, constipation and blood in stool.  Endocrine: Negative.  Negative for polydipsia, polyphagia and polyuria.  Genitourinary: Negative.   Musculoskeletal: Positive for arthralgias (the joints in his fingers hurt). Negative for back pain, gait problem, joint swelling, myalgias, neck pain and neck stiffness.  Skin: Negative.   Allergic/Immunologic: Negative.   Neurological: Negative.  Negative for dizziness, tremors, weakness, light-headedness and numbness.  Hematological: Negative.  Negative for adenopathy. Does not  bruise/bleed easily.  Psychiatric/Behavioral: Negative.        Objective:   Physical Exam  Vitals reviewed. Constitutional: He is oriented to person, place, and time. He appears well-developed and well-nourished. No distress.  HENT:  Head: Normocephalic and atraumatic.  Mouth/Throat: Oropharynx is clear and moist. No oropharyngeal exudate.  Eyes: Conjunctivae are normal. Right eye exhibits no discharge. Left eye exhibits no discharge. No scleral icterus.  Neck: Normal range of motion. Neck supple. No JVD present. No tracheal deviation present. No thyromegaly present.  Cardiovascular: Normal rate, regular rhythm, normal heart sounds and intact distal pulses.  Exam reveals no gallop and no friction rub.   No murmur heard. Pulmonary/Chest: Effort normal and breath sounds normal. No stridor. No respiratory distress. He has no wheezes. He has no rales. He exhibits no tenderness.  Abdominal: Soft. Bowel sounds are normal. He exhibits no distension and no mass. There is no tenderness. There is no rebound and no guarding.  Musculoskeletal: Normal range of motion. He exhibits no edema and no tenderness.  Lymphadenopathy:    He has no cervical adenopathy.  Neurological: He is oriented to person, place, and time.  Skin: Skin is warm and dry. No rash noted. He is not diaphoretic. No erythema. No pallor.  Psychiatric: He has a normal mood and affect. His behavior is normal. Judgment and thought content normal.    Lab Results  Component Value Date   WBC 6.6 01/25/2013   HGB 11.6* 01/25/2013   HCT 35.8* 01/25/2013   PLT 192.0 01/25/2013   GLUCOSE 123* 01/25/2013   CHOL 204* 09/23/2012   TRIG 200.0* 09/23/2012   HDL 26.10* 09/23/2012   LDLDIRECT 143.0 09/23/2012   LDLCALC 128* 06/17/2011   ALT 29 01/25/2013   AST 22 01/25/2013  NA 137 01/25/2013   K 3.8 01/25/2013   CL 106 01/25/2013   CREATININE 0.8 01/25/2013   BUN 13 01/25/2013   CO2 25 01/25/2013   TSH 0.42 01/25/2013   PSA 0.00  Repeated and verified X2.* 11/11/2011   HGBA1C 7.2* 01/25/2013        Assessment & Plan:

## 2013-05-31 NOTE — Addendum Note (Signed)
Addended by: Janith Lima on: 05/31/2013 03:02 PM   Modules accepted: Orders, Medications

## 2013-05-31 NOTE — Assessment & Plan Note (Signed)
He is doing well on his lifestyle modifications I will recheck his A1C today and will monitor his renal function

## 2013-05-31 NOTE — Assessment & Plan Note (Signed)
He has decided not to treat this

## 2013-05-31 NOTE — Patient Instructions (Signed)
Type 2 Diabetes Mellitus, Adult Type 2 diabetes mellitus, often simply referred to as type 2 diabetes, is a long-lasting (chronic) disease. In type 2 diabetes, the pancreas does not make enough insulin (a hormone), the cells are less responsive to the insulin that is made (insulin resistance), or both. Normally, insulin moves sugars from food into the tissue cells. The tissue cells use the sugars for energy. The lack of insulin or the lack of normal response to insulin causes excess sugars to build up in the blood instead of going into the tissue cells. As a result, high blood sugar (hyperglycemia) develops. The effect of high sugar (glucose) levels can cause many complications. Type 2 diabetes was also previously called adult-onset diabetes but it can occur at any age.  RISK FACTORS  A person is predisposed to developing type 2 diabetes if someone in the family has the disease and also has one or more of the following primary risk factors:  Overweight.  An inactive lifestyle.  A history of consistently eating high-calorie foods. Maintaining a normal weight and regular physical activity can reduce the chance of developing type 2 diabetes. SYMPTOMS  A person with type 2 diabetes may not show symptoms initially. The symptoms of type 2 diabetes appear slowly. The symptoms include:  Increased thirst (polydipsia).  Increased urination (polyuria).  Increased urination during the night (nocturia).  Weight loss. This weight loss may be rapid.  Frequent, recurring infections.  Tiredness (fatigue).  Weakness.  Vision changes, such as blurred vision.  Fruity smell to your breath.  Abdominal pain.  Nausea or vomiting.  Cuts or bruises which are slow to heal.  Tingling or numbness in the hands or feet. DIAGNOSIS Type 2 diabetes is frequently not diagnosed until complications of diabetes are present. Type 2 diabetes is diagnosed when symptoms or complications are present and when blood  glucose levels are increased. Your blood glucose level may be checked by one or more of the following blood tests:  A fasting blood glucose test. You will not be allowed to eat for at least 8 hours before a blood sample is taken.  A random blood glucose test. Your blood glucose is checked at any time of the day regardless of when you ate.  A hemoglobin A1c blood glucose test. A hemoglobin A1c test provides information about blood glucose control over the previous 3 months.  An oral glucose tolerance test (OGTT). Your blood glucose is measured after you have not eaten (fasted) for 2 hours and then after you drink a glucose-containing beverage. TREATMENT   You may need to take insulin or diabetes medicine daily to keep blood glucose levels in the desired range.  You will need to match insulin dosing with exercise and healthy food choices. The treatment goal is to maintain the before meal blood sugar (preprandial glucose) level at 70 130 mg/dL. HOME CARE INSTRUCTIONS   Have your hemoglobin A1c level checked twice a year.  Perform daily blood glucose monitoring as directed by your caregiver.  Monitor urine ketones when you are ill and as directed by your caregiver.  Take your diabetes medicine or insulin as directed by your caregiver to maintain your blood glucose levels in the desired range.  Never run out of diabetes medicine or insulin. It is needed every day.  Adjust insulin based on your intake of carbohydrates. Carbohydrates can raise blood glucose levels but need to be included in your diet. Carbohydrates provide vitamins, minerals, and fiber which are an essential part of   a healthy diet. Carbohydrates are found in fruits, vegetables, whole grains, dairy products, legumes, and foods containing added sugars.    Eat healthy foods. Alternate 3 meals with 3 snacks.  Lose weight if overweight.  Carry a medical alert card or wear your medical alert jewelry.  Carry a 15 gram  carbohydrate snack with you at all times to treat low blood glucose (hypoglycemia). Some examples of 15 gram carbohydrate snacks include:  Glucose tablets, 3 or 4   Glucose gel, 15 gram tube  Raisins, 2 tablespoons (24 grams)  Jelly beans, 6  Animal crackers, 8  Regular pop, 4 ounces (120 mL)  Gummy treats, 9  Recognize hypoglycemia. Hypoglycemia occurs with blood glucose levels of 70 mg/dL and below. The risk for hypoglycemia increases when fasting or skipping meals, during or after intense exercise, and during sleep. Hypoglycemia symptoms can include:  Tremors or shakes.  Decreased ability to concentrate.  Sweating.  Increased heart rate.  Headache.  Dry mouth.  Hunger.  Irritability.  Anxiety.  Restless sleep.  Altered speech or coordination.  Confusion.  Treat hypoglycemia promptly. If you are alert and able to safely swallow, follow the 15:15 rule:  Take 15 20 grams of rapid-acting glucose or carbohydrate. Rapid-acting options include glucose gel, glucose tablets, or 4 ounces (120 mL) of fruit juice, regular soda, or low fat milk.  Check your blood glucose level 15 minutes after taking the glucose.  Take 15 20 grams more of glucose if the repeat blood glucose level is still 70 mg/dL or below.  Eat a meal or snack within 1 hour once blood glucose levels return to normal.    Be alert to polyuria and polydipsia which are early signs of hyperglycemia. An early awareness of hyperglycemia allows for prompt treatment. Treat hyperglycemia as directed by your caregiver.  Engage in at least 150 minutes of moderate-intensity physical activity a week, spread over at least 3 days of the week or as directed by your caregiver. In addition, you should engage in resistance exercise at least 2 times a week or as directed by your caregiver.  Adjust your medicine and food intake as needed if you start a new exercise or sport.  Follow your sick day plan at any time you  are unable to eat or drink as usual.  Avoid tobacco use.  Limit alcohol intake to no more than 1 drink per day for nonpregnant women and 2 drinks per day for men. You should drink alcohol only when you are also eating food. Talk with your caregiver whether alcohol is safe for you. Tell your caregiver if you drink alcohol several times a week.  Follow up with your caregiver regularly.  Schedule an eye exam soon after the diagnosis of type 2 diabetes and then annually.  Perform daily skin and foot care. Examine your skin and feet daily for cuts, bruises, redness, nail problems, bleeding, blisters, or sores. A foot exam by a caregiver should be done annually.  Brush your teeth and gums at least twice a day and floss at least once a day. Follow up with your dentist regularly.  Share your diabetes management plan with your workplace or school.  Stay up-to-date with immunizations.  Learn to manage stress.  Obtain ongoing diabetes education and support as needed.  Participate in, or seek rehabilitation as needed to maintain or improve independence and quality of life. Request a physical or occupational therapy referral if you are having foot or hand numbness or difficulties with grooming,   dressing, eating, or physical activity. SEEK MEDICAL CARE IF:   You are unable to eat food or drink fluids for more than 6 hours.  You have nausea and vomiting for more than 6 hours.  Your blood glucose level is over 240 mg/dL.  There is a change in mental status.  You develop an additional serious illness.  You have diarrhea for more than 6 hours.  You have been sick or have had a fever for a couple of days and are not getting better.  You have pain during any physical activity.  SEEK IMMEDIATE MEDICAL CARE IF:  You have difficulty breathing.  You have moderate to large ketone levels. MAKE SURE YOU:  Understand these instructions.  Will watch your condition.  Will get help right away if  you are not doing well or get worse. Document Released: 01/28/2005 Document Revised: 10/23/2011 Document Reviewed: 08/27/2011 [employer] Patient Information 2014 [employer].  

## 2013-05-31 NOTE — Progress Notes (Signed)
Pre visit review using our clinic review tool, if applicable. No additional management support is needed unless otherwise documented below in the visit note. 

## 2013-05-31 NOTE — Assessment & Plan Note (Addendum)
I will recheck his TSH and will adjust his dose if needed  Late note - his TSH is suppressed so I have lowered the dose of synthroid

## 2013-05-31 NOTE — Assessment & Plan Note (Signed)
He has NOT been compliant with B12 in the nasal spray or injectable forms I will try get to get him to take a po form

## 2013-09-27 ENCOUNTER — Encounter: Payer: Self-pay | Admitting: Internal Medicine

## 2013-09-27 ENCOUNTER — Ambulatory Visit (INDEPENDENT_AMBULATORY_CARE_PROVIDER_SITE_OTHER): Payer: BC Managed Care – PPO | Admitting: Internal Medicine

## 2013-09-27 ENCOUNTER — Other Ambulatory Visit (INDEPENDENT_AMBULATORY_CARE_PROVIDER_SITE_OTHER): Payer: BC Managed Care – PPO

## 2013-09-27 VITALS — BP 120/80 | HR 60 | Temp 97.9°F | Resp 16 | Ht 73.0 in | Wt 296.8 lb

## 2013-09-27 DIAGNOSIS — D518 Other vitamin B12 deficiency anemias: Secondary | ICD-10-CM

## 2013-09-27 DIAGNOSIS — D519 Vitamin B12 deficiency anemia, unspecified: Secondary | ICD-10-CM

## 2013-09-27 DIAGNOSIS — E039 Hypothyroidism, unspecified: Secondary | ICD-10-CM

## 2013-09-27 DIAGNOSIS — G5603 Carpal tunnel syndrome, bilateral upper limbs: Secondary | ICD-10-CM

## 2013-09-27 DIAGNOSIS — G5600 Carpal tunnel syndrome, unspecified upper limb: Secondary | ICD-10-CM

## 2013-09-27 DIAGNOSIS — IMO0001 Reserved for inherently not codable concepts without codable children: Secondary | ICD-10-CM

## 2013-09-27 DIAGNOSIS — D509 Iron deficiency anemia, unspecified: Secondary | ICD-10-CM

## 2013-09-27 DIAGNOSIS — E1165 Type 2 diabetes mellitus with hyperglycemia: Principal | ICD-10-CM

## 2013-09-27 LAB — BASIC METABOLIC PANEL
BUN: 14 mg/dL (ref 6–23)
CHLORIDE: 107 meq/L (ref 96–112)
CO2: 24 meq/L (ref 19–32)
Calcium: 9.3 mg/dL (ref 8.4–10.5)
Creatinine, Ser: 0.8 mg/dL (ref 0.4–1.5)
GFR: 104.39 mL/min (ref >60.00)
GLUCOSE: 139 mg/dL — AB (ref 70–99)
Potassium: 3.9 mEq/L (ref 3.5–5.1)
SODIUM: 140 meq/L (ref 135–145)

## 2013-09-27 LAB — CBC WITH DIFFERENTIAL/PLATELET
BASOS ABS: 0 10*3/uL (ref 0.0–0.1)
Basophils Relative: 0.7 % (ref 0.0–3.0)
Eosinophils Absolute: 0.3 10*3/uL (ref 0.0–0.7)
Eosinophils Relative: 5.9 % — ABNORMAL HIGH (ref 0.0–5.0)
HEMATOCRIT: 34.7 % — AB (ref 39.0–52.0)
HEMOGLOBIN: 10.9 g/dL — AB (ref 13.0–17.0)
LYMPHS ABS: 1.1 10*3/uL (ref 0.7–4.0)
Lymphocytes Relative: 19.3 % (ref 12.0–46.0)
MCHC: 31.2 g/dL (ref 30.0–36.0)
MCV: 74.4 fl — AB (ref 78.0–100.0)
MONO ABS: 0.5 10*3/uL (ref 0.1–1.0)
Monocytes Relative: 9.4 % (ref 3.0–12.0)
Neutro Abs: 3.7 10*3/uL (ref 1.4–7.7)
Neutrophils Relative %: 64.7 % (ref 43.0–77.0)
Platelets: 206 10*3/uL (ref 150.0–400.0)
RBC: 4.67 Mil/uL (ref 4.22–5.81)
RDW: 17.1 % — ABNORMAL HIGH (ref 11.5–15.5)
WBC: 5.7 10*3/uL (ref 4.0–10.5)

## 2013-09-27 LAB — IBC PANEL
IRON: 31 ug/dL — AB (ref 42–165)
SATURATION RATIOS: 7.3 % — AB (ref 20.0–50.0)
TRANSFERRIN: 303.3 mg/dL (ref 212.0–360.0)

## 2013-09-27 LAB — HEMOGLOBIN A1C: Hgb A1c MFr Bld: 8 % — ABNORMAL HIGH (ref 4.6–6.5)

## 2013-09-27 LAB — FOLATE: FOLATE: 13.8 ng/mL (ref >5.9)

## 2013-09-27 LAB — VITAMIN B12: Vitamin B-12: 341 pg/mL (ref 211–911)

## 2013-09-27 LAB — FERRITIN: Ferritin: 6.4 ng/mL — ABNORMAL LOW (ref 22.0–322.0)

## 2013-09-27 LAB — TSH: TSH: 1.2 u[IU]/mL (ref 0.35–4.50)

## 2013-09-27 NOTE — Progress Notes (Signed)
Subjective:    Patient ID: Adam Wilson, male    DOB: Jul 15, 1965, 48 y.o.   MRN: [id number]  HPI Comments: He complains of pain with numbness and tingling in his 3rd 4th 5th fingers on both sides for several months, he does a lot of repetitive activity at work and has a history of CTS.  Diabetes He presents for his follow-up diabetic visit. He has type 2 diabetes mellitus. His disease course has been stable. There are no hypoglycemic associated symptoms. Pertinent negatives for hypoglycemia include no dizziness, headaches, seizures, speech difficulty or tremors. There are no diabetic associated symptoms. Pertinent negatives for diabetes include no blurred vision, no chest pain, no fatigue, no foot paresthesias, no foot ulcerations, no polydipsia, no polyphagia, no polyuria, no visual change, no weakness and no weight loss. There are no hypoglycemic complications. Symptoms are worsening. There are no diabetic complications. Current diabetic treatment includes oral agent (dual therapy). He is compliant with treatment most of the time. His weight is increasing steadily. He is following a generally unhealthy diet. When asked about meal planning, he reported none. He participates in exercise intermittently. His home blood glucose trend is increasing steadily. An ACE inhibitor/angiotensin II receptor blocker is not being taken. He does not see a podiatrist.Eye exam is current.      Review of Systems  Constitutional: Negative.  Negative for fever, chills, weight loss, diaphoresis, appetite change and fatigue.  HENT: Negative.   Eyes: Negative.  Negative for blurred vision.  Respiratory: Negative.  Negative for cough, choking, chest tightness, shortness of breath, wheezing and stridor.   Cardiovascular: Negative.  Negative for chest pain, palpitations and leg swelling.  Gastrointestinal: Negative.  Negative for abdominal pain.  Endocrine: Negative.  Negative for polydipsia, polyphagia and polyuria.    Genitourinary: Negative.   Musculoskeletal: Negative.  Negative for arthralgias, back pain, gait problem, joint swelling, myalgias, neck pain and neck stiffness.  Skin: Negative.  Negative for rash.  Allergic/Immunologic: Negative.   Neurological: Positive for numbness. Negative for dizziness, tremors, seizures, syncope, facial asymmetry, speech difficulty, weakness, light-headedness and headaches.  Hematological: Negative.  Negative for adenopathy. Does not bruise/bleed easily.  Psychiatric/Behavioral: Negative.        Objective:   Physical Exam  Vitals reviewed. Constitutional: He is oriented to person, place, and time. He appears well-developed and well-nourished. No distress.  HENT:  Head: Normocephalic and atraumatic.  Mouth/Throat: Oropharynx is clear and moist. No oropharyngeal exudate.  Eyes: Conjunctivae are normal. Right eye exhibits no discharge. Left eye exhibits no discharge. No scleral icterus.  Neck: Normal range of motion. Neck supple. No JVD present. No tracheal deviation present. No thyromegaly present.  Cardiovascular: Normal rate, regular rhythm, normal heart sounds and intact distal pulses.  Exam reveals no gallop and no friction rub.   No murmur heard. Pulmonary/Chest: Effort normal and breath sounds normal. No stridor. No respiratory distress. He has no wheezes. He has no rales. He exhibits no tenderness.  Abdominal: Soft. Bowel sounds are normal. He exhibits no distension and no mass. There is no tenderness. There is no rebound and no guarding.  Musculoskeletal: Normal range of motion. He exhibits no edema and no tenderness.       Right wrist: Normal.       Left wrist: Normal.  Lymphadenopathy:    He has no cervical adenopathy.  Neurological: He is alert and oriented to person, place, and time. He has normal reflexes. He displays normal reflexes. No cranial nerve deficit. He exhibits normal  muscle tone. Coordination normal.  Neg tinel's and phalen's tests  bilaterally  Skin: Skin is warm and dry. No rash noted. He is not diaphoretic. No erythema. No pallor.  Psychiatric: He has a normal mood and affect. His behavior is normal. Judgment and thought content normal.     Lab Results  Component Value Date   WBC 6.8 05/31/2013   HGB 12.0* 05/31/2013   HCT 37.7* 05/31/2013   PLT 203.0 05/31/2013   GLUCOSE 116* 05/31/2013   CHOL 127 05/31/2013   TRIG 131.0 05/31/2013   HDL 27.30* 05/31/2013   LDLDIRECT 143.0 09/23/2012   LDLCALC 74 05/31/2013   ALT 29 01/25/2013   AST 22 01/25/2013   NA 139 05/31/2013   K 4.2 05/31/2013   CL 106 05/31/2013   CREATININE 0.8 05/31/2013   BUN 13 05/31/2013   CO2 26 05/31/2013   TSH 0.30* 05/31/2013   PSA 0.00 Repeated and verified X2.* 11/11/2011   HGBA1C 6.6* 05/31/2013       Assessment & Plan:

## 2013-09-27 NOTE — Progress Notes (Signed)
Pre visit review using our clinic review tool, if applicable. No additional management support is needed unless otherwise documented below in the visit note. 

## 2013-09-27 NOTE — Patient Instructions (Signed)
Carpal Tunnel Syndrome The carpal tunnel is a narrow area located on the palm side of your wrist. The tunnel is formed by the wrist bones and ligaments. Nerves, blood vessels, and tendons pass through the carpal tunnel. Repeated wrist motion or certain diseases may cause swelling within the tunnel. This swelling pinches the main nerve in the wrist (median nerve) and causes the painful hand and arm condition called carpal tunnel syndrome. CAUSES   Repeated wrist motions.  Wrist injuries.  Certain diseases like arthritis, diabetes, alcoholism, hyperthyroidism, and kidney failure.  Obesity.  Pregnancy. SYMPTOMS   A "pins and needles" feeling in your fingers or hand, especially in your thumb, index and middle fingers.  Tingling or numbness in your fingers or hand.  An aching feeling in your entire arm, especially when your wrist and elbow are bent for long periods of time.  Wrist pain that goes up your arm to your shoulder.  Pain that goes down into your palm or fingers.  A weak feeling in your hands. DIAGNOSIS  Your health care provider will take your history and perform a physical exam. An electromyography test may be needed. This test measures electrical signals sent out by your nerves into the muscles. The electrical signals are usually slowed by carpal tunnel syndrome. You may also need X-rays. TREATMENT  Carpal tunnel syndrome may clear up by itself. Your health care provider may recommend a wrist splint or medicine such as a nonsteroidal anti-inflammatory medicine. Cortisone injections may help. Sometimes, surgery may be needed to free the pinched nerve.  HOME CARE INSTRUCTIONS   Take all medicine as directed by your health care provider. Only take over-the-counter or prescription medicines for pain, discomfort, or fever as directed by your health care provider.  If you were given a splint to keep your wrist from bending, wear it as directed. It is important to wear the splint at  night. Wear the splint for as long as you have pain or numbness in your hand, arm, or wrist. This may take 1 to 2 months.  Rest your wrist from any activity that may be causing your pain. If your symptoms are work-related, you may need to talk to your employer about changing to a job that does not require using your wrist.  Put ice on your wrist after long periods of wrist activity.  Put ice in a plastic bag.  Place a towel between your skin and the bag.  Leave the ice on for 15-20 minutes, 03-04 times a day.  Keep all follow-up visits as directed by your health care provider. This includes any orthopedic referrals, physical therapy, and rehabilitation. Any delay in getting necessary care could result in a delay or failure of your condition to heal. SEEK IMMEDIATE MEDICAL CARE IF:   You have new, unexplained symptoms.  Your symptoms get worse and are not helped or controlled with medicines. MAKE SURE YOU:   Understand these instructions.  Will watch your condition.  Will get help right away if you are not doing well or get worse. Document Released: 01/26/2000 Document Revised: 06/14/2013 Document Reviewed: 12/14/2010 Exit[employer] Patient Information 2015 [employer]. This information is not intended to replace advice given to you by your health care provider. Make sure you discuss any questions you have with your health care provider.  

## 2013-09-28 NOTE — Assessment & Plan Note (Signed)
His anemia has worsened some and his iron level is low again I have asked him to be more compliant with his iron replacement therapy

## 2013-09-28 NOTE — Assessment & Plan Note (Signed)
His b12 level has improved

## 2013-09-28 NOTE — Assessment & Plan Note (Signed)
I have ordered a NCS/EMG to confirm that this is the cause for his symptoms and to see how severe it is Will consider PT/surgery if indicated For now he will try nsaids for pain releif

## 2013-09-28 NOTE — Assessment & Plan Note (Signed)
His blood sugars are not well controlled He agrees to work on his lifestyle modifications

## 2013-09-28 NOTE — Assessment & Plan Note (Signed)
His TSH is on the normal range He will stay on the current dose 

## 2013-10-08 ENCOUNTER — Other Ambulatory Visit: Payer: Self-pay | Admitting: Internal Medicine

## 2013-10-08 DIAGNOSIS — G5603 Carpal tunnel syndrome, bilateral upper limbs: Secondary | ICD-10-CM

## 2013-10-27 ENCOUNTER — Other Ambulatory Visit: Payer: Self-pay | Admitting: Internal Medicine

## 2013-12-01 ENCOUNTER — Ambulatory Visit (INDEPENDENT_AMBULATORY_CARE_PROVIDER_SITE_OTHER): Payer: BC Managed Care – PPO | Admitting: Neurology

## 2013-12-01 DIAGNOSIS — G5603 Carpal tunnel syndrome, bilateral upper limbs: Secondary | ICD-10-CM

## 2013-12-01 DIAGNOSIS — G5602 Carpal tunnel syndrome, left upper limb: Secondary | ICD-10-CM

## 2013-12-01 DIAGNOSIS — G5601 Carpal tunnel syndrome, right upper limb: Secondary | ICD-10-CM

## 2013-12-01 NOTE — Procedures (Signed)
[facility]  [address], [address]  [place], [place] [address] Tel: [phone] Fax:  [phone] Test Date:  12/01/2013  Patient: Adam Wilson DOB: January 31, 1966 Physician: Narda Amber, DO  Sex: Male Height: 6\' 1"  Ref Phys: Scarlette Calico  ID#: [id number] Temp: 33.4C Technician:    Patient Complaints: This is a 48 year-old gentleman with history of diabetes mellitus and hypothyroidism presenting for evaluation of bilateral hand paresthesias.  NCV & EMG Findings: Extensive electrodiagnostic testing of the right upper extremity and additional studies of the left reveals:  1. The right median sensory response shows reduced amplitude and prolonged distal latency. The left median sensory response is absent. Bilateral ulnar and radial sensory responses are reduced in amplitude. 2. Bilateral median motor responses shows prolonged distal latency with preserved amplitude. The right ulnar motor study also shows conduction velocity slowing across the elbow. 3. Chronic motor axon loss changes are seen affecting the first dorsal interosseous muscle, flexor digitorum profundus 4&5, pronator teres, and triceps muscles on the right. Needle electrode examination of the left upper extremity is within normal limits.  Impression: 1. 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 Overall, these findings are moderate in degree electrically. 2. A superimposed bilateral median neuropathy at or distal to the wrist consistent with the clinical diagnosis of carpal tunnel syndrome is also likely; moderate in degree electrically. 3. Right ulnar neuropathy with slowing across the elbow, demyelinating and axon loss in type; mild to moderate in degree electrically. 4. There is no definite evidence of a cervical radiculopathy affecting the upper extremities.   ___________________________ Narda Amber, DO    Nerve  Conduction Studies Anti Sensory Summary Table   Site NR Peak (ms) Norm Peak (ms) P-T Amp (V) Norm P-T Amp  Left Median Anti Sensory (2nd Digit)  32C  Wrist NR  <3.4  >20  Right Median Anti Sensory (2nd Digit)  Wrist    5.8 <3.4 4.9 >20  Left Radial Anti Sensory (Base 1st Digit)  32C  Wrist    2.5 <2.7 14.8 >18  Right Radial Anti Sensory (Base 1st Digit)  Wrist    2.1 <2.7 15.8 >18  Left Ulnar Anti Sensory (5th Digit)  32C  Wrist    3.0 <3.1 6.0 >12  Site 2    3.1  5.5   Site 3    3.1  5.9   Site 4    3.1  5.9   Right Ulnar Anti Sensory (5th Digit)  Wrist    3.1 <3.1 5.8 >12   Motor Summary Table   Site NR Onset (ms) Norm Onset (ms) O-P Amp (mV) Norm O-P Amp Site1 Site2 Delta-0 (ms) Dist (cm) Vel (m/s) Norm Vel (m/s)  Left Median Motor (Abd Poll Brev)  32C  Wrist    5.7 <3.9 9.4 >6 Elbow Wrist 5.7 33.0 58 >50  Elbow    11.4  9.0         Right Median Motor (Abd Poll Brev)  Wrist    5.4 <3.9 11.1 >6 Elbow Wrist 6.6 34.0 52 >50  Elbow    12.0  10.6         Left Ulnar Motor (Abd Dig Minimi)  32C  Wrist    2.8 <3.1 11.3 >7 B Elbow Wrist 5.1 27.0 53 >50  B Elbow    7.9  10.2  A Elbow B Elbow 1.9 10.0 53 >50  A Elbow    9.8  10.1         Right Ulnar Motor (Abd Dig Minimi)  Wrist    2.8 <3.1 12.5 >7 B Elbow Wrist 5.5 27.5 50 >50  B Elbow    8.3  11.0  A Elbow B Elbow 3.0 11.0 37 >50  A Elbow    11.3  10.1          EMG   Side Muscle Ins Act Fibs Psw Fasc Number Recrt Dur Dur. Amp Amp. Poly Poly. Comment  Right 1stDorInt Nml Nml Nml Nml 1- Mod-R Few 1+ Few 1+ Nml Nml N/A  Right Abd Poll Brev Nml Nml Nml Nml Nml Nml Nml Nml Nml Nml Nml Nml N/A  Right ABD Dig Min Nml Nml Nml Nml Nml Nml Nml Nml Nml Nml Nml Nml N/A  Right Ext Indicis Nml Nml Nml Nml Nml Nml Nml Nml Nml Nml Nml Nml N/A  Right FlexPolLong Nml Nml Nml Nml Nml Nml Nml Nml Nml Nml Nml Nml N/A  Right FlexDigProf 4,5 Nml Nml Nml Nml 1- Mod-R Few 1+ Few 1+ Nml Nml N/A  Right PronatorTeres Nml Nml Nml Nml 1- Mod Nml Nml  Nml Nml Nml Nml N/A  Right Biceps Nml Nml Nml Nml Nml Nml Nml Nml Nml Nml Nml Nml N/A  Right Triceps Nml Nml Nml Nml 1- Mod-R Some 1+ Nml Nml Nml Nml N/A  Right Deltoid Nml Nml Nml Nml Nml Nml Nml Nml Nml Nml Nml Nml N/A  Left 1stDorInt Nml Nml Nml Nml Nml Nml Nml Nml Nml Nml Nml Nml N/A  Left Ext Indicis Nml Nml Nml Nml Nml Nml Nml Nml Nml Nml Nml Nml N/A  Left Abd Poll Brev Nml Nml Nml Nml Nml Nml Nml Nml Nml Nml Nml Nml N/A  Left FlexPolLong Nml Nml Nml Nml Nml Nml Nml Nml Nml Nml Nml Nml N/A  Left PronatorTeres Nml Nml Nml Nml Nml Nml Nml Nml Nml Nml Nml Nml N/A  Left Biceps Nml Nml Nml Nml Nml Nml Nml Nml Nml Nml Nml Nml N/A  Left Triceps Nml Nml Nml Nml Nml Nml Nml Nml Nml Nml Nml Nml N/A      Waveforms:

## 2013-12-02 ENCOUNTER — Encounter: Payer: Self-pay | Admitting: Internal Medicine

## 2013-12-02 ENCOUNTER — Other Ambulatory Visit: Payer: Self-pay | Admitting: Internal Medicine

## 2013-12-02 DIAGNOSIS — G629 Polyneuropathy, unspecified: Secondary | ICD-10-CM

## 2013-12-08 ENCOUNTER — Other Ambulatory Visit (INDEPENDENT_AMBULATORY_CARE_PROVIDER_SITE_OTHER): Payer: BC Managed Care – PPO

## 2013-12-08 ENCOUNTER — Encounter: Payer: Self-pay | Admitting: Internal Medicine

## 2013-12-08 ENCOUNTER — Ambulatory Visit (INDEPENDENT_AMBULATORY_CARE_PROVIDER_SITE_OTHER): Payer: BC Managed Care – PPO | Admitting: Internal Medicine

## 2013-12-08 VITALS — BP 110/76 | HR 50 | Temp 98.1°F | Resp 13 | Wt 294.5 lb

## 2013-12-08 DIAGNOSIS — IMO0001 Reserved for inherently not codable concepts without codable children: Secondary | ICD-10-CM

## 2013-12-08 DIAGNOSIS — J209 Acute bronchitis, unspecified: Secondary | ICD-10-CM

## 2013-12-08 DIAGNOSIS — M791 Myalgia: Secondary | ICD-10-CM

## 2013-12-08 DIAGNOSIS — G629 Polyneuropathy, unspecified: Secondary | ICD-10-CM

## 2013-12-08 DIAGNOSIS — R197 Diarrhea, unspecified: Secondary | ICD-10-CM

## 2013-12-08 DIAGNOSIS — G5600 Carpal tunnel syndrome, unspecified upper limb: Secondary | ICD-10-CM

## 2013-12-08 DIAGNOSIS — M609 Myositis, unspecified: Secondary | ICD-10-CM

## 2013-12-08 LAB — BASIC METABOLIC PANEL
BUN: 11 mg/dL (ref 6–23)
CO2: 21 meq/L (ref 19–32)
Calcium: 9.1 mg/dL (ref 8.4–10.5)
Chloride: 107 mEq/L (ref 96–112)
Creatinine, Ser: 1 mg/dL (ref 0.4–1.5)
GFR: 89.26 mL/min (ref >60.00)
Glucose, Bld: 117 mg/dL — ABNORMAL HIGH (ref 70–99)
POTASSIUM: 4.1 meq/L (ref 3.5–5.1)
SODIUM: 138 meq/L (ref 135–145)

## 2013-12-08 LAB — CBC WITH DIFFERENTIAL/PLATELET
Basophils Absolute: 0 10*3/uL (ref 0.0–0.1)
Basophils Relative: 0.8 % (ref 0.0–3.0)
EOS PCT: 5.8 % — AB (ref 0.0–5.0)
Eosinophils Absolute: 0.3 10*3/uL (ref 0.0–0.7)
HCT: 35 % — ABNORMAL LOW (ref 39.0–52.0)
Hemoglobin: 10.9 g/dL — ABNORMAL LOW (ref 13.0–17.0)
LYMPHS PCT: 21.7 % (ref 12.0–46.0)
Lymphs Abs: 1.2 10*3/uL (ref 0.7–4.0)
MCHC: 31.3 g/dL (ref 30.0–36.0)
MCV: 71.9 fl — ABNORMAL LOW (ref 78.0–100.0)
MONO ABS: 0.5 10*3/uL (ref 0.1–1.0)
MONOS PCT: 9.8 % (ref 3.0–12.0)
Neutro Abs: 3.4 10*3/uL (ref 1.4–7.7)
Neutrophils Relative %: 61.9 % (ref 43.0–77.0)
PLATELETS: 201 10*3/uL (ref 150.0–400.0)
RBC: 4.87 Mil/uL (ref 4.22–5.81)
RDW: 18.4 % — ABNORMAL HIGH (ref 11.5–15.5)
WBC: 5.4 10*3/uL (ref 4.0–10.5)

## 2013-12-08 MED ORDER — HYDROCODONE-HOMATROPINE 5-1.5 MG/5ML PO SYRP
5.0000 mL | ORAL_SOLUTION | Freq: Four times a day (QID) | ORAL | Status: DC | PRN
Start: 2013-12-08 — End: 2014-01-24

## 2013-12-08 MED ORDER — DOXYCYCLINE HYCLATE 100 MG PO TABS: 100.0000 mg | ORAL_TABLET | Freq: Two times a day (BID) | ORAL | Status: DC

## 2013-12-08 NOTE — Patient Instructions (Addendum)
Please remain out of work until 12/09/2013.  Plain Mucinex (NOT D) for thick secretions ;force NON dairy fluids .   Nasal cleansing in the shower as discussed with lather of mild shampoo.After 10 seconds wash off lather while  exhaling through nostrils. Make sure that all residual soap is removed to prevent irritation.  Flonase OR Nasacort AQ 1 spray in each nostril twice a day as needed. Use the "crossover" technique into opposite nostril spraying toward opposite ear @ 45 degree angle, not straight up into nostril.  Use a Neti pot daily only  as needed for significant sinus congestion; going from open side to congested side . Plain Allegra (NOT D )  160 daily , Loratidine 10 mg , OR Zyrtec 10 mg @ bedtime  as needed for itchy eyes & sneezing.

## 2013-12-08 NOTE — Progress Notes (Signed)
Pre visit review using our clinic review tool, if applicable. No additional management support is needed unless otherwise documented below in the visit note. 

## 2013-12-08 NOTE — Progress Notes (Signed)
   Subjective:    Patient ID: Adam Wilson, male    DOB: 09-10-1965, 48 y.o.   MRN: [id number]  HPI   His symptoms began 10/26 in the evening history as generalized achiness and head congestion. He has had the Flu shot.  On 10/27 he had loose to watery stools on at least 4 occasions  He left work yesterday at noon.He stayed in bed from 2:00 until this morning except to get up for some nutrition at dinnertime.  He has been taken allergy medicines and nonsteroidals with only partial benefit  As of today he's having aching with head congestion and a cough productive of white-yellow sputum  He has facial sinus pressure without pain. He has some sore throat  He's had possible low-grade fever but he did not  check his temp. He has had chills and sweats      Review of Systems  Frontal headache pain, facial pain , nasal purulence, dental pain, sore throat , otic pain or otic discharge denied.   He has had a nerve conduction test and inquired as to the results. I reviewed the studies. It does suggest bilateral carpal tunnel syndrome. Also suggested was an UE sensory polyneuropathy and R ulnar neuropathy. He has a history rectal cancer for which he received chemotherapy. A1c was 8% in August. Recent B12 level was WNL.  A follow-up Neurology appointment has been requested.      Objective:   Physical Exam   Pertinent or positive findings include: He appears fatigued but is in no acute distress. 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 There is central weight access with a small panniculus.  Eyes: No conjunctival inflammation or scleral icterus is present. Oral exam: Dental hygiene is good. Lips and gums are healthy appearing.There is no oropharyngeal erythema or exudate noted.  Heart:  Normal rate and regular rhythm. S1 and S2 normal without gallop, murmur, click, rub or other extra sounds   Lungs:Chest clear to auscultation; no wheezes, rhonchi,rales ,or rubs present.No increased work of breathing.    Abdomen: bowel sounds normal, soft and non-tender without masses, organomegaly or hernias noted.  No guarding or rebound. No flank tenderness to percussion. Vascular : all pulses equal ; no bruits present. Skin:Warm & dry.  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 Doxycycline was prescribed as he has had diarrhea.  Chemistries and CBC will be checked.  He was advised to see the Neurologist in follow-up.

## 2013-12-22 ENCOUNTER — Telehealth: Payer: Self-pay | Admitting: Oncology

## 2013-12-22 NOTE — Telephone Encounter (Signed)
pt called to sched appt...done...pt aware of new d.t °

## 2014-01-20 ENCOUNTER — Other Ambulatory Visit: Payer: Self-pay | Admitting: *Deleted

## 2014-01-20 DIAGNOSIS — C187 Malignant neoplasm of sigmoid colon: Secondary | ICD-10-CM

## 2014-01-24 ENCOUNTER — Encounter: Payer: Self-pay | Admitting: Internal Medicine

## 2014-01-24 ENCOUNTER — Ambulatory Visit: Payer: BC Managed Care – PPO | Admitting: Internal Medicine

## 2014-01-24 ENCOUNTER — Telehealth: Payer: Self-pay | Admitting: Nurse Practitioner

## 2014-01-24 ENCOUNTER — Other Ambulatory Visit (INDEPENDENT_AMBULATORY_CARE_PROVIDER_SITE_OTHER): Payer: BC Managed Care – PPO

## 2014-01-24 ENCOUNTER — Other Ambulatory Visit: Payer: BC Managed Care – PPO

## 2014-01-24 ENCOUNTER — Ambulatory Visit (INDEPENDENT_AMBULATORY_CARE_PROVIDER_SITE_OTHER): Payer: BC Managed Care – PPO | Admitting: Internal Medicine

## 2014-01-24 ENCOUNTER — Ambulatory Visit (HOSPITAL_BASED_OUTPATIENT_CLINIC_OR_DEPARTMENT_OTHER): Payer: BC Managed Care – PPO | Admitting: Nurse Practitioner

## 2014-01-24 VITALS — BP 120/80 | HR 75 | Temp 98.2°F | Resp 16 | Ht 73.0 in | Wt 297.0 lb

## 2014-01-24 VITALS — BP 143/79 | HR 83 | Temp 97.8°F | Resp 18 | Ht 73.0 in | Wt 297.2 lb

## 2014-01-24 DIAGNOSIS — E118 Type 2 diabetes mellitus with unspecified complications: Secondary | ICD-10-CM

## 2014-01-24 DIAGNOSIS — G5601 Carpal tunnel syndrome, right upper limb: Secondary | ICD-10-CM

## 2014-01-24 DIAGNOSIS — D509 Iron deficiency anemia, unspecified: Secondary | ICD-10-CM

## 2014-01-24 DIAGNOSIS — D519 Vitamin B12 deficiency anemia, unspecified: Secondary | ICD-10-CM

## 2014-01-24 DIAGNOSIS — E785 Hyperlipidemia, unspecified: Secondary | ICD-10-CM

## 2014-01-24 DIAGNOSIS — G5603 Carpal tunnel syndrome, bilateral upper limbs: Secondary | ICD-10-CM

## 2014-01-24 DIAGNOSIS — G5600 Carpal tunnel syndrome, unspecified upper limb: Secondary | ICD-10-CM

## 2014-01-24 DIAGNOSIS — C187 Malignant neoplasm of sigmoid colon: Secondary | ICD-10-CM

## 2014-01-24 DIAGNOSIS — G629 Polyneuropathy, unspecified: Secondary | ICD-10-CM

## 2014-01-24 DIAGNOSIS — E038 Other specified hypothyroidism: Secondary | ICD-10-CM

## 2014-01-24 DIAGNOSIS — Z85048 Personal history of other malignant neoplasm of rectum, rectosigmoid junction, and anus: Secondary | ICD-10-CM

## 2014-01-24 DIAGNOSIS — G5693 Unspecified mononeuropathy of bilateral upper limbs: Secondary | ICD-10-CM | POA: Insufficient documentation

## 2014-01-24 DIAGNOSIS — G5602 Carpal tunnel syndrome, left upper limb: Secondary | ICD-10-CM

## 2014-01-24 LAB — COMPREHENSIVE METABOLIC PANEL
ALBUMIN: 4 g/dL (ref 3.5–5.2)
ALT: 30 U/L (ref 0–53)
AST: 26 U/L (ref 0–37)
Alkaline Phosphatase: 76 U/L (ref 39–117)
BUN: 14 mg/dL (ref 6–23)
CALCIUM: 9.1 mg/dL (ref 8.4–10.5)
CHLORIDE: 106 meq/L (ref 96–112)
CO2: 26 mEq/L (ref 19–32)
Creatinine, Ser: 0.9 mg/dL (ref 0.4–1.5)
GFR: 97.45 mL/min (ref >60.00)
GLUCOSE: 122 mg/dL — AB (ref 70–99)
POTASSIUM: 3.8 meq/L (ref 3.5–5.1)
SODIUM: 136 meq/L (ref 135–145)
TOTAL PROTEIN: 7.3 g/dL (ref 6.0–8.3)
Total Bilirubin: 0.4 mg/dL (ref 0.2–1.2)

## 2014-01-24 LAB — CBC WITH DIFFERENTIAL/PLATELET
BASOS PCT: 0.7 % (ref 0.0–3.0)
Basophils Absolute: 0 10*3/uL (ref 0.0–0.1)
Eosinophils Absolute: 0.4 10*3/uL (ref 0.0–0.7)
Eosinophils Relative: 5.5 % — ABNORMAL HIGH (ref 0.0–5.0)
HCT: 34.7 % — ABNORMAL LOW (ref 39.0–52.0)
Hemoglobin: 10.6 g/dL — ABNORMAL LOW (ref 13.0–17.0)
LYMPHS PCT: 21.1 % (ref 12.0–46.0)
Lymphs Abs: 1.4 10*3/uL (ref 0.7–4.0)
MCHC: 30.6 g/dL (ref 30.0–36.0)
MCV: 73 fl — ABNORMAL LOW (ref 78.0–100.0)
MONO ABS: 0.7 10*3/uL (ref 0.1–1.0)
Monocytes Relative: 10.2 % (ref 3.0–12.0)
NEUTROS PCT: 62.5 % (ref 43.0–77.0)
Neutro Abs: 4.2 10*3/uL (ref 1.4–7.7)
PLATELETS: 211 10*3/uL (ref 150.0–400.0)
RBC: 4.76 Mil/uL (ref 4.22–5.81)
RDW: 18.8 % — ABNORMAL HIGH (ref 11.5–15.5)
WBC: 6.7 10*3/uL (ref 4.0–10.5)

## 2014-01-24 LAB — TSH: TSH: 0.25 u[IU]/mL — AB (ref 0.35–4.50)

## 2014-01-24 LAB — VITAMIN B12: VITAMIN B 12: 1119 pg/mL — AB (ref 211–911)

## 2014-01-24 LAB — FERRITIN: Ferritin: 7.1 ng/mL — ABNORMAL LOW (ref 22.0–322.0)

## 2014-01-24 LAB — FOLATE: Folate: 14.7 ng/mL (ref >5.9)

## 2014-01-24 LAB — HEMOGLOBIN A1C: HEMOGLOBIN A1C: 7.3 % — AB (ref 4.6–6.5)

## 2014-01-24 MED ORDER — ROSUVASTATIN CALCIUM 10 MG PO TABS: 10.0000 mg | ORAL_TABLET | Freq: Every day | ORAL | Status: DC

## 2014-01-24 NOTE — Assessment & Plan Note (Signed)
I will recheck his CBC and his iron levels today

## 2014-01-24 NOTE — Assessment & Plan Note (Signed)
I will recheck his TSH and will adjust his dose if needed 

## 2014-01-24 NOTE — Telephone Encounter (Signed)
Pt confirmed labs/ov per 12/14 POF, gave pt AVS..... KJ °

## 2014-01-24 NOTE — Assessment & Plan Note (Signed)
Will refer to hand surgery to if there is a surgical option to help with his pain

## 2014-01-24 NOTE — Progress Notes (Signed)
  [place] OFFICE PROGRESS NOTE   Diagnosis:  Rectal cancer   INTERVAL HISTORY:   Adam Wilson was last seen at the [facility] in September 2013. He denies any change in bowel habits. No bloody or black stools. No abdominal pain. He continues to have tingling in the fingertips and toes. He has a good appetite. He notes a poor energy level.  Objective:  Vital signs in last 24 hours:  Blood pressure 143/79, pulse 83, temperature 97.8 F (36.6 C), temperature source Oral, resp. rate 18, height 6\' 1"  (1.854 m), weight 297 lb 3.2 oz (134.809 kg), SpO2 99 %.    HEENT: no thrush or ulcers. Lymphatics: prominent bilateral axillary fat pads; small bilateral inguinal nodes. No palpable cervical or supraclavicular lymph nodes. Resp: lungs clear bilaterally. Cardio: regular rate and rhythm. GI: abdomen soft and nontender. No hepatomegaly. No mass. Vascular: no leg edema.    Lab Results:  Lab Results  Component Value Date   WBC 5.4 12/08/2013   HGB 10.9* 12/08/2013   HCT 35.0* 12/08/2013   MCV 71.9* 12/08/2013   PLT 201.0 12/08/2013   NEUTROABS 3.4 12/08/2013  CEA pending. 09/27/2013 ferritin 6.4 Imaging:  No results found.  Medications: I have reviewed the patient's current medications.  Assessment/Plan: 1. Rectal cancer, status post neoadjuvant fusional 5-FU and concurrent radiation, followed by low anterior resection and diverting ileostomy, 10/22/2007. He completed 9 cycles of adjuvant FOLFOX chemotherapy 03/28/2008. Restaging CT evaluation 06/19/2009 was negative for evidence of metastatic disease. Restaging CT evaluation 07/16/2010 was negative for evidence of metastatic disease. 2. 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 The neuropathy symptoms have improved. Neurontin did not help his symptoms. 3. Small bilateral inguinal lymph nodes,  11/28/2008 - Stable. 4. History of thrombocytopenia secondary to chemotherapy.  5. History of iron deficiency anemia-status post an endoscopic evaluation in the spring of 2013: negative EGD, colonoscopy with granulation tissue at the rectum-status post fulguration 06/05/2011. 6. Pelvic lymphadenopathy on CT, 02/19/2007, with no pathologically enlarged lymph nodes on pelvic CT, 07/04/2008, 06/19/2009, and 07/16/2010. 7. History of a vertebral compression fracture.  8. Status post Port-A-Cath removal, 06/282010.  9. Status post ileostomy takedown by Dr. Johney Maine, 04/25/2008. 10. Erectile dysfunction with a low testosterone level, January 2012 - He was referred to Urology. 11. Elevated glucose on 07/16/2010 - being followed by Dr. Ronnald Ramp. 12. "Numbness and tingling" in the legs reported when here 07/26/2010 - it was felt unlikely that the numbness/tingling was related to prior oxaliplatin. Symptoms have improved. Question if related to B12 deficiency. 30. B12 deficiency diagnosed July 2012. He is taking oral B12.    Disposition: Adam Wilson remains in clinical remission from rectal cancer. We will followup on the CEA from today.  Review of labs from 12/08/2013 shows a persistent microcytic anemia. He has a history of iron deficiency. He reports being scheduled for labs later today following an office visit with Dr. Ronnald Ramp. We will request a CBC and ferritin. He was given a packet of stool cards to complete.  We scheduled a return visit in 6 months with a CEA. That appointment will be adjusted accordingly pending review of the CBC and ferritin.    [name] ANP/GNP-BC   01/24/2014  2:26 PM

## 2014-01-24 NOTE — Assessment & Plan Note (Signed)
He is using oral B12 I will check his B12 level to see that is getting enough supplement

## 2014-01-24 NOTE — Patient Instructions (Signed)

## 2014-01-24 NOTE — Assessment & Plan Note (Signed)
I will refer to neurology to see if there is anything that can be done to help him with his symptoms

## 2014-01-24 NOTE — Progress Notes (Signed)
Pre visit review using our clinic review tool, if applicable. No additional management support is needed unless otherwise documented below in the visit note. 

## 2014-01-24 NOTE — Progress Notes (Signed)
Subjective:    Patient ID: Adam Wilson, male    DOB: 20-May-1965, 48 y.o.   MRN: [id number]  Diabetes He presents for his follow-up diabetic visit. He has type 2 diabetes mellitus. His disease course has been stable. There are no hypoglycemic associated symptoms. Pertinent negatives for hypoglycemia include no dizziness, seizures, speech difficulty or tremors. Associated symptoms include foot paresthesias. Pertinent negatives for diabetes include no fatigue, no foot ulcerations, no polydipsia, no polyphagia, no polyuria, no visual change, no weakness and no weight loss. There are no hypoglycemic complications. Symptoms are stable. 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      Review of Systems  Constitutional: Negative.  Negative for chills, weight loss, diaphoresis, appetite change and fatigue.  HENT: Negative.   Eyes: Negative.   Respiratory: Negative.  Negative for cough, choking, chest tightness and stridor.   Cardiovascular: Negative.  Negative for leg swelling.  Gastrointestinal: Negative.  Negative for nausea, vomiting, abdominal pain, diarrhea, constipation and blood in stool.  Endocrine: Negative.  Negative for polydipsia, polyphagia and polyuria.  Genitourinary: Positive for impotence. Negative for enuresis and difficulty urinating.  Musculoskeletal: Positive for arthralgias. Negative for myalgias, back pain, joint swelling, gait problem and neck stiffness.       Pain in both hands and wrists  Skin: Negative.  Negative for rash.  Allergic/Immunologic: Negative.   Neurological: Positive for numbness (in fingers and toes). Negative for dizziness, tremors, seizures, syncope, facial asymmetry, speech difficulty, weakness and light-headedness.  Hematological: Negative.  Negative for adenopathy. Does not bruise/bleed easily.  Psychiatric/Behavioral: Negative.        Objective:   Physical Exam  Constitutional: He is oriented to person, place, and time.  He appears well-developed and well-nourished. No distress.  HENT:  Head: Normocephalic and atraumatic.  Mouth/Throat: Oropharynx is clear and moist. No oropharyngeal exudate.  Eyes: Conjunctivae are normal. Right eye exhibits no discharge. Left eye exhibits no discharge. No scleral icterus.  Neck: Normal range of motion. Neck supple. No JVD present. No tracheal deviation present. No thyromegaly present.  Cardiovascular: Normal rate, regular rhythm, normal heart sounds and intact distal pulses.  Exam reveals no gallop and no friction rub.   No murmur heard. Pulmonary/Chest: Effort normal and breath sounds normal. No stridor. No respiratory distress. He has no wheezes. He has no rales. He exhibits no tenderness.  Abdominal: Soft. Bowel sounds are normal. He exhibits no distension and no mass. There is no tenderness. There is no rebound and no guarding.  Musculoskeletal: Normal range of motion. He exhibits no edema or tenderness.  Lymphadenopathy:    He has no cervical adenopathy.  Neurological: He is oriented to person, place, and time.  Skin: Skin is warm and dry. No rash noted. He is not diaphoretic. No erythema. No pallor.  Vitals reviewed.     Lab Results  Component Value Date   WBC 5.4 12/08/2013   HGB 10.9* 12/08/2013   HCT 35.0* 12/08/2013   PLT 201.0 12/08/2013   GLUCOSE 117* 12/08/2013   CHOL 127 05/31/2013   TRIG 131.0 05/31/2013   HDL 27.30* 05/31/2013   LDLDIRECT 143.0 09/23/2012   LDLCALC 74 05/31/2013   ALT 29 01/25/2013   AST 22 01/25/2013   NA 138 12/08/2013   K 4.1 12/08/2013   CL 107 12/08/2013   CREATININE 1.0 12/08/2013   BUN 11 12/08/2013   CO2 21 12/08/2013   TSH 1.20 09/27/2013   PSA 0.00 Repeated and verified X2.* 11/11/2011   HGBA1C 8.0* 09/27/2013  Assessment & Plan:

## 2014-01-24 NOTE — Assessment & Plan Note (Signed)
I will check his A1C and will adjust his dose if needed He is due for an eye exam

## 2014-01-25 ENCOUNTER — Other Ambulatory Visit: Payer: Self-pay | Admitting: Internal Medicine

## 2014-01-25 ENCOUNTER — Telehealth: Payer: Self-pay | Admitting: *Deleted

## 2014-01-25 ENCOUNTER — Encounter: Payer: Self-pay | Admitting: Internal Medicine

## 2014-01-25 DIAGNOSIS — E038 Other specified hypothyroidism: Secondary | ICD-10-CM

## 2014-01-25 DIAGNOSIS — D509 Iron deficiency anemia, unspecified: Secondary | ICD-10-CM

## 2014-01-25 LAB — CEA: CEA: 0.9 ng/mL (ref 0.0–5.0)

## 2014-01-25 MED ORDER — LEVOTHYROXINE SODIUM 150 MCG PO TABS: 150.0000 ug | ORAL_TABLET | Freq: Every day | ORAL | Status: DC

## 2014-01-25 MED ORDER — FERROUS SULFATE 325 (65 FE) MG PO TABS: 325.0000 mg | ORAL_TABLET | Freq: Two times a day (BID) | ORAL | Status: DC

## 2014-01-25 NOTE — Telephone Encounter (Signed)
Called and informed patient of normal cea.  Per Lisa K. Thomas, NP.  Patient verbalized understanding.  

## 2014-01-25 NOTE — Telephone Encounter (Signed)
-----   Message from Owens Shark, NP sent at 01/25/2014  8:52 AM EST ----- Please let him know CEA is normal.

## 2014-01-26 ENCOUNTER — Telehealth: Payer: Self-pay | Admitting: Nurse Practitioner

## 2014-01-26 ENCOUNTER — Telehealth: Payer: Self-pay | Admitting: *Deleted

## 2014-01-26 ENCOUNTER — Other Ambulatory Visit: Payer: Self-pay | Admitting: Nurse Practitioner

## 2014-01-26 DIAGNOSIS — D509 Iron deficiency anemia, unspecified: Secondary | ICD-10-CM

## 2014-01-26 DIAGNOSIS — C187 Malignant neoplasm of sigmoid colon: Secondary | ICD-10-CM

## 2014-01-26 NOTE — Telephone Encounter (Signed)
Adam Wilson called back and was given lab results and directions to start iron tid-return stool cards, then start iron. Will see him again in 6 weeks with lab. He understands and agrees.

## 2014-01-26 NOTE — Telephone Encounter (Signed)
-----   Message from Owens Shark, NP sent at 01/26/2014  2:05 PM EST ----- Please let him know labs confirmed iron deficiency anemia. He needs to begin ferrous sulfate 325 mg three times a day. Please remind him to return stool cards. We would like to see him back in 6 weeks with labs. Thanks.

## 2014-01-26 NOTE — Telephone Encounter (Signed)
Left message for patient to call back regarding lab results.

## 2014-01-26 NOTE — Telephone Encounter (Signed)
Pt confirmed labs/ov per 12/16 POF, gave pt AVS.... KJ °

## 2014-02-28 ENCOUNTER — Other Ambulatory Visit: Payer: Self-pay | Admitting: Internal Medicine

## 2014-03-07 ENCOUNTER — Telehealth: Payer: Self-pay | Admitting: *Deleted

## 2014-03-09 ENCOUNTER — Telehealth: Payer: Self-pay | Admitting: *Deleted

## 2014-03-09 ENCOUNTER — Telehealth: Payer: Self-pay | Admitting: Internal Medicine

## 2014-03-09 MED ORDER — METFORMIN HCL 500 MG PO TABS: 500.0000 mg | ORAL_TABLET | Freq: Every day | ORAL | Status: DC

## 2014-03-09 NOTE — Telephone Encounter (Signed)
Pt called in needing a refill on his metFORMIN (GLUCOPHAGE) 500 MG tablet [[id number]    [facility] in [place]

## 2014-03-09 NOTE — Telephone Encounter (Signed)
[name]

## 2014-03-09 NOTE — Telephone Encounter (Signed)
Patient called asking if he is supposed to come in tomorrow.  Also asked if there is a later time available.  Informed him appointment was adjusted due to lab results.  He will keep appointment times and see [place] tomorrow.

## 2014-03-10 ENCOUNTER — Other Ambulatory Visit (HOSPITAL_BASED_OUTPATIENT_CLINIC_OR_DEPARTMENT_OTHER): Payer: BLUE CROSS/BLUE SHIELD

## 2014-03-10 ENCOUNTER — Ambulatory Visit (HOSPITAL_BASED_OUTPATIENT_CLINIC_OR_DEPARTMENT_OTHER): Payer: BLUE CROSS/BLUE SHIELD | Admitting: Oncology

## 2014-03-10 ENCOUNTER — Telehealth: Payer: Self-pay | Admitting: Nurse Practitioner

## 2014-03-10 VITALS — BP 132/75 | HR 50 | Temp 97.9°F | Resp 18 | Ht 73.0 in | Wt 293.3 lb

## 2014-03-10 DIAGNOSIS — Z85048 Personal history of other malignant neoplasm of rectum, rectosigmoid junction, and anus: Secondary | ICD-10-CM

## 2014-03-10 DIAGNOSIS — C187 Malignant neoplasm of sigmoid colon: Secondary | ICD-10-CM

## 2014-03-10 DIAGNOSIS — D509 Iron deficiency anemia, unspecified: Secondary | ICD-10-CM

## 2014-03-10 DIAGNOSIS — E538 Deficiency of other specified B group vitamins: Secondary | ICD-10-CM

## 2014-03-10 LAB — CBC WITH DIFFERENTIAL/PLATELET
BASO%: 1 % (ref 0.0–2.0)
BASOS ABS: 0.1 10*3/uL (ref 0.0–0.1)
EOS%: 5.9 % (ref 0.0–7.0)
Eosinophils Absolute: 0.4 10*3/uL (ref 0.0–0.5)
HCT: 38.7 % (ref 38.4–49.9)
HGB: 11.5 g/dL — ABNORMAL LOW (ref 13.0–17.1)
LYMPH%: 22.6 % (ref 14.0–49.0)
MCH: 21.9 pg — AB (ref 27.2–33.4)
MCHC: 29.8 g/dL — ABNORMAL LOW (ref 32.0–36.0)
MCV: 73.5 fL — ABNORMAL LOW (ref 79.3–98.0)
MONO#: 0.5 10*3/uL (ref 0.1–0.9)
MONO%: 8.3 % (ref 0.0–14.0)
NEUT#: 3.8 10*3/uL (ref 1.5–6.5)
NEUT%: 62.2 % (ref 39.0–75.0)
Platelets: 198 10*3/uL (ref 140–400)
RBC: 5.27 10*6/uL (ref 4.20–5.82)
RDW: 18.3 % — ABNORMAL HIGH (ref 11.0–14.6)
WBC: 6.2 10*3/uL (ref 4.0–10.3)
lymph#: 1.4 10*3/uL (ref 0.9–3.3)

## 2014-03-10 NOTE — Telephone Encounter (Signed)
, °

## 2014-03-10 NOTE — Progress Notes (Signed)
  Adam Wilson OFFICE PROGRESS NOTE   Diagnosis: Rectal cancer  INTERVAL HISTORY:   Adam Wilson returns as scheduled. He was noted to have iron deficiency anemia when he was here last month. He did not return stool Hemoccult cards and he has not started iron replacement. He denies bleeding. He feels well. He reports neuropathy symptoms and stiffness in the hands. He is being evaluated by neurology and reports he has carpal tunnel syndrome. He also has numbness and tingling in the feet.  He complains of erectile dysfunction. Levitra did not help.  Objective:  Vital signs in last 24 hours:  Blood pressure 132/75, pulse 50, temperature 97.9 F (36.6 C), temperature source Oral, resp. rate 18, height 6\' 1"  (1.854 m), weight 293 lb 4.8 oz (133.04 kg).    HEENT: Neck without mass Lymphatics: Prominent bilateral axillary fat pads versus soft mobile 1-2 cm nodes, 1 cm bilateral inguinal nodes. No cervical or supra-and reticular nodes Resp: Lungs clear bilaterally Cardio: Regular rate and rhythm GI: No hepatosplenomegaly, nontender, no mass Vascular: Leg edema   Lab Results:  Lab Results  Component Value Date   WBC 6.2 03/10/2014   HGB 11.5* 03/10/2014   HCT 38.7 03/10/2014   MCV 73.5* 03/10/2014   PLT 198 03/10/2014   NEUTROABS 3.8 03/10/2014      Lab Results  Component Value Date   CEA 0.9 01/24/2014    Medications: I have reviewed the patient's current medications.  Assessment/Plan: 1. Rectal cancer, status post neoadjuvant fusional 5-FU and concurrent radiation, followed by low anterior resection and diverting ileostomy, 10/22/2007. He completed 9 cycles of adjuvant FOLFOX chemotherapy 03/28/2008. Restaging CT evaluation 06/19/2009 was negative for evidence of metastatic disease. Restaging CT evaluation 07/16/2010 was negative for evidence of metastatic disease. 2. Prolonged cold sensitivity and peripheral "tingling" secondary to oxaliplatin - Oxaliplatin  was deleted from the chemotherapy regimen beginning 03/14/2008. The neuropathy symptoms have improved. Neurontin did not help his symptoms. 3. Small bilateral inguinal lymph nodes, 11/28/2008 - Stable. 4. History of thrombocytopenia secondary to chemotherapy.  5. History of iron deficiency anemia-status post an endoscopic evaluation in the spring of 2013: negative EGD, colonoscopy with granulation tissue at the rectum-status post fulguration 06/05/2011. 6. Pelvic lymphadenopathy on CT, 02/19/2007, with no pathologically enlarged lymph nodes on pelvic CT, 07/04/2008, 06/19/2009, and 07/16/2010. 7. History of a vertebral compression fracture.  8. Status post Port-A-Cath removal, 06/282010.  9. Status post ileostomy takedown by Dr. Johney Maine, 04/25/2008. 10. Erectile dysfunction with a low testosterone level, January 2012 - Levitra did not help, I suggested he follow-up with Dr. Ronnald Ramp 11. Elevated glucose on 07/16/2010 - being followed by Dr. Ronnald Ramp. 12. Neuropathy symptoms in the hands-potentially carpal tunnel, I doubt his current symptoms are related to oxaliplatin. 49. B12 deficiency diagnosed July 2012. He is taking oral B12.     Disposition: He remains in clinical remission from rectal cancer. Adam Wilson has iron deficiency anemia. He will return stool Hemoccult cards and then begin iron therapy. We will refer him to Dr. Deatra Ina if the stool Hemoccult is positive.  He will continue follow-up with neurology for evaluation of the neuropathy symptoms.  He will discuss the erectile dysfunction with Dr. Ronnald Ramp.  Adam Wilson will return for an office visit and CBC 05/30/2014.  Betsy Coder, MD  03/10/2014  12:15 PM

## 2014-03-15 ENCOUNTER — Ambulatory Visit: Payer: BC Managed Care – PPO | Admitting: Neurology

## 2014-03-15 ENCOUNTER — Other Ambulatory Visit: Payer: Self-pay

## 2014-03-15 ENCOUNTER — Ambulatory Visit (HOSPITAL_BASED_OUTPATIENT_CLINIC_OR_DEPARTMENT_OTHER): Payer: BLUE CROSS/BLUE SHIELD

## 2014-03-15 DIAGNOSIS — C187 Malignant neoplasm of sigmoid colon: Secondary | ICD-10-CM

## 2014-03-15 DIAGNOSIS — D509 Iron deficiency anemia, unspecified: Secondary | ICD-10-CM

## 2014-03-15 LAB — FECAL OCCULT BLOOD, GUAIAC: OCCULT BLOOD: NEGATIVE

## 2014-03-16 ENCOUNTER — Telehealth: Payer: Self-pay | Admitting: Neurology

## 2014-03-16 NOTE — Telephone Encounter (Signed)
Pt appt 03/15/14 marked as no show due to same day R/S. As pt has already set a new date, a no show letter will not be sent / Sherri S.

## 2014-03-17 ENCOUNTER — Telehealth: Payer: Self-pay | Admitting: *Deleted

## 2014-03-17 NOTE — Telephone Encounter (Signed)
-----   Message from Ladell Pier, MD sent at 03/16/2014  8:50 PM EST ----- Please call patient, stool negative for blood, continue iron, f/u as scheduled Schedule urinalysis to look for hematuria if not done past6 months

## 2014-03-17 NOTE — Telephone Encounter (Signed)
Called pt with lab results. Stool negative for blood. Begin iron. Follow up as scheduled. Pt voiced understanding. Dr. Benay Spice recommends checking urinalysis to look for bleeding. Pt has not had one in past six months. He stated he will contact Dr. Ronnald Ramp' office to schedule UA.

## 2014-03-22 ENCOUNTER — Encounter: Payer: Self-pay | Admitting: Neurology

## 2014-03-22 ENCOUNTER — Ambulatory Visit (INDEPENDENT_AMBULATORY_CARE_PROVIDER_SITE_OTHER): Payer: BLUE CROSS/BLUE SHIELD | Admitting: Neurology

## 2014-03-22 VITALS — BP 120/80 | HR 81 | Ht 73.0 in | Wt 293.1 lb

## 2014-03-22 DIAGNOSIS — G609 Hereditary and idiopathic neuropathy, unspecified: Secondary | ICD-10-CM

## 2014-03-22 DIAGNOSIS — G5602 Carpal tunnel syndrome, left upper limb: Secondary | ICD-10-CM

## 2014-03-22 DIAGNOSIS — G620 Drug-induced polyneuropathy: Secondary | ICD-10-CM

## 2014-03-22 DIAGNOSIS — T451X5A Adverse effect of antineoplastic and immunosuppressive drugs, initial encounter: Secondary | ICD-10-CM

## 2014-03-22 DIAGNOSIS — E1140 Type 2 diabetes mellitus with diabetic neuropathy, unspecified: Secondary | ICD-10-CM

## 2014-03-22 DIAGNOSIS — G5603 Carpal tunnel syndrome, bilateral upper limbs: Secondary | ICD-10-CM

## 2014-03-22 DIAGNOSIS — G5601 Carpal tunnel syndrome, right upper limb: Secondary | ICD-10-CM

## 2014-03-22 MED ORDER — GABAPENTIN 300 MG PO CAPS: 300.0000 mg | ORAL_CAPSULE | Freq: Three times a day (TID) | ORAL | Status: DC

## 2014-03-22 NOTE — Patient Instructions (Addendum)
1.  Start Gabapentin 300 mg tablets    Morning       Afternoon        Evening  Week 1                                  1 tab              Week 2 1 tab                   1 tab              Continue  1 tab          1 tab            1 tab         Call with update in 1 month, to determine further increase in medication.  If you develop increased sleepiness, stay at the lower dose.        2.  Check blood work  3.  Start using wrist splint  4.  Consider occupational therapy going forward  4.  Encourage tight glycemic control  5.  Encouraged healthy lifestyle and weight loss  6.  Return to clinic in 9-months

## 2014-03-22 NOTE — Progress Notes (Signed)
Adam Wilson   Date: 03/22/2014   Adam Wilson MRN: [id number] DOB: [birth year]   Dear Dr. Ronnald Ramp:   Thank you for your kind referral of Adam Wilson for consultation of bilateral hand paresthesias. Although his history is well known to you, please allow [place] to reiterate it for the purpose of our medical record. The patient was accompanied to the clinic by self.    History of Present Illness: Adam Wilson is a 49 y.o. right-handed Caucasian male with diabetes mellitus, hypothyroidism, hyperlipidemia, rectal cancer (2009 s/p diverting ileostomy, chemotherapy, and radiation) presenting for evaluation of bilateral hand paresthesias.  In 2009, he was diagnosed with rectal cancer and started chemotherapy with 5-FU and adjuvant FOLFAX which included oxaliplatin.  He developed tingling sensation of the feet and cold sensitivity while on oxaliplatin so this was stopped.  He noticed some improvement after stopping the medication.  He has constant tingling of the feet involving the toes. Around early 2015, he started noticing that his hands would become numb.  He has not noticed any triggers.  He is able to shake his hands which sometimes helps. Symptoms do not wake him up at night.  He denies any neck or back pain, weakness, or imbalance.  He was diagnosed with diabetes mellitus around the same time and feels he could be better with his sugars, if he was to pay attention to his diet more.  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  Patient was referred here for further evaluation.  Of note, he reports having NCS/EMG ~20+ years ago at which time he was told he has mild CTS bilaterally and was treated conservatively with wrist splint and therapy.  Out-side paper records,  electronic medical record, and images have been reviewed where available and summarized as:  Lab Results  Component Value Date   HGBA1C 7.3* 01/24/2014   Lab Results  Component Value Date   TSH 0.25* 01/24/2014   Lab Results  Component Value Date   VITAMINB12 1119* 01/24/2014   EMG 12/01/2013: 1. 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 Overall, these findings are moderate in degree electrically. 2. A superimposed bilateral median neuropathy at or distal to the wrist consistent with the clinical diagnosis of carpal tunnel syndrome is also likely; moderate in degree electrically. 3. Right ulnar neuropathy with slowing across the elbow, demyelinating and axon loss in type; mild to moderate in degree electrically. 4. There is no definite evidence of a cervical radiculopathy affecting the upper extremities.  Past Medical History  Diagnosis Date  . Rectal carcinoma 2009  . Diabetes mellitus   . 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 Kidney stone   . Colon polyps     Past Surgical History  Procedure Laterality Date  . Colon surgery  2010  . Tonsillectomy    . Portacath placement    . Ileostomy    . Ileostomy closure    . Umbilical hernia repair    . Hernia repair    . Flexible sigmoidoscopy  06/05/2011    Procedure: FLEXIBLE SIGMOIDOSCOPY;  Surgeon: Inda Castle, MD;  Location: [facility];  Service: Endoscopy;  Laterality: N/A;  . Hot hemostasis  06/05/2011    Procedure: HOT HEMOSTASIS (ARGON PLASMA COAGULATION/BICAP);  Surgeon:  Inda Castle, MD;  Location: [facility];  Service: Endoscopy;  Laterality: N/A;     Medications:  Current Outpatient Prescriptions on File Prior to Wilson  Medication Sig Dispense Refill  . ferrous sulfate 325 (65 FE) MG tablet Take 1 tablet (325 mg total) by mouth 2 (two) times daily with a meal. 180 tablet 3  . levothyroxine (SYNTHROID, LEVOTHROID)  150 MCG tablet Take 1 tablet (150 mcg total) by mouth daily. 90 tablet 1  . metFORMIN (GLUCOPHAGE) 500 MG tablet Take 1 tablet (500 mg total) by mouth daily with breakfast. 90 tablet 3   No current facility-administered medications on file prior to Wilson.    Allergies: No Known Allergies  Family History: Family History  Problem Relation Age of Onset  . Arthritis Mother   . Diabetes Mother   . Hyperlipidemia Father   . Stroke Father   . Hypertension Father   . Heart attack Father   . Stomach cancer Paternal Grandmother   . Colon cancer Neg Hx   . Esophageal cancer Neg Hx   . Rectal cancer Neg Hx   . Heart disease Brother   . Healthy Son   . Healthy Daughter     Social History: History   Social History  . Marital Status: Married    Spouse Name: N/A    Number of Children: 2  . Years of Education: N/A   Occupational History  . ARTIST    Social History Main Topics  . Smoking status: Never Smoker   . Smokeless tobacco: Never Used  . Alcohol Use: No     Comment: social  . Drug Use: No  . Sexual Activity: Yes   Other Topics Concern  . Not on file   Social History Narrative   Lives with wife in a one story home with a basement.  Has 2 children.     Works for a [employer] in the [employer].     Education: some college classes.     Review of Systems:  CONSTITUTIONAL: No fevers, chills, night sweats, or weight loss.   EYES: No visual changes or eye pain ENT: No hearing changes.  No history of nose bleeds.   RESPIRATORY: No cough, wheezing and shortness of breath.   CARDIOVASCULAR: Negative for chest pain, and palpitations.   GI: Negative for abdominal discomfort, blood in stools or black stools.  No recent change in bowel habits.   GU:  No history of incontinence.   MUSCLOSKELETAL: No history of joint pain or swelling.  No myalgias.   SKIN: Negative for lesions, rash, and itching.   HEMATOLOGY/ONCOLOGY: Negative for prolonged bleeding, bruising easily,  and swollen nodes.  +history of cancer.   ENDOCRINE: Negative for cold or heat intolerance, polydipsia or goiter.   PSYCH:  No depression or anxiety symptoms.   NEURO: As Above.   Vital Signs:  BP 120/80 mmHg  Pulse 81  Ht 6\' 1"  (1.854 m)  Wt 293 lb 2 oz (132.961 kg)  BMI 38.68 kg/m2  SpO2 99%   General Medical Exam:   General:  Well appearing, comfortable.   Eyes/ENT: see cranial nerve examination.   Neck: No masses appreciated.  Full range of motion without tenderness.  No carotid bruits. Respiratory:  Clear to auscultation, good air entry bilaterally.   Cardiac:  Regular rate and rhythm, no murmur.   Extremities:  No deformities, edema, or skin discoloration.  Skin:  No rashes or lesions.  Neurological Exam: MENTAL STATUS including orientation  to time, place, person, recent and remote memory, attention span and concentration, language, and fund of knowledge is normal.  Speech is not dysarthric.  CRANIAL NERVES: II:  No visual field defects.  Unremarkable fundi.   III-IV-VI: Pupils equal round and reactive to light.  Normal conjugate, extra-ocular eye movements in all directions of gaze.  No nystagmus.  No ptosis.   V:  Normal facial sensation.  Jaw jerk is absent.   VII:  Normal facial symmetry and movements.  No pathologic facial reflexes.  VIII:  Normal hearing and vestibular function.   IX-X:  Normal palatal movement.   XI:  Normal shoulder shrug and head rotation.   XII:  Normal tongue strength and range of motion, no deviation or fasciculation.  MOTOR:  No atrophy, fasciculations or abnormal movements.  No pronator drift.  Tone is normal.    Right Upper Extremity:    Left Upper Extremity:    Deltoid  5/5   Deltoid  5/5   Biceps  5/5   Biceps  5/5   Triceps  5/5   Triceps  5/5   Wrist extensors  5/5   Wrist extensors  5/5   Wrist flexors  5/5   Wrist flexors  5/5   Finger extensors  5/5   Finger extensors  5/5   Finger flexors  5/5   Finger flexors  5/5   Dorsal  interossei  5/5   Dorsal interossei  5/5   Abductor pollicis  5/5   Abductor pollicis  5/5   Tone (Ashworth scale)  0  Tone (Ashworth scale)  0   Right Lower Extremity:    Left Lower Extremity:    Hip flexors  5/5   Hip flexors  5/5   Hip extensors  5/5   Hip extensors  5/5   Knee flexors  5/5   Knee flexors  5/5   Knee extensors  5/5   Knee extensors  5/5   Dorsiflexors  5/5   Dorsiflexors  5/5   Plantarflexors  5/5   Plantarflexors  5/5   Toe extensors  5/5   Toe extensors  5/5   Toe flexors  5/5   Toe flexors  5/5   Tone (Ashworth scale)  0  Tone (Ashworth scale)  0   MSRs:  Right                                                                 Left brachioradialis 2+  brachioradialis 2+  biceps 2+  biceps 2+  triceps 2+  triceps 2+  patellar 2+  patellar 2+  ankle jerk 0  ankle jerk 0  Hoffman no  Hoffman no  plantar response down  plantar response down   SENSORY: Reduced pin prick over the fingers and distal to ankles bilaterally.  Vibration is diminished to 90% at kness, 50% at ankle, and 20% at great toe.  Proprioception and light touch intact.  Romberg's sign absent.   COORDINATION/GAIT: Normal finger-to- nose-finger and heel-to-shin.  Intact rapid alternating movements bilaterally.  Able to rise from a chair without using arms.  Gait narrow based and stable. Tandem and stressed gait intact.    IMPRESSION: Adam Wilson is a 49 year-old gentleman with history of rectal cancer s/p chemotherapy  and radiation (2009-2012) and diabetes mellitus (Hba1c 7.1) presenting for evaluation of bilateral hand paresthesias. His EMG shows predominately sensory neuropathy affecting the hands with possibility of a superimposed bilateral median neuropathy at the wrist and ulnar neuropathy at the right elbow.  In the setting of a generalized neuropathy, it is difficult to tease apart an overlapping entrapment neuropathy, so I recommend treating conservatively for carpal tunnel syndrome and ulnar  neuropathy.  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  PLAN/RECOMMENDATIONS:  1.  Check vitamin B1, vitamin B6, copper, ceruloplasmin 2.  Start gabapentin 300mg  qhs and uptitrate to 300mg  TID 3.  Recommended trial of using wrist splint, avoid hyperflexion of the wrist and right elbow 4.  Patient declined occupational therapy  5.  Encourage tight glycemic control 6.  Encouraged healthy lifestyle and weight loss 7.  Return to clinic in 3 months.   The duration of this appointment Wilson was 50 minutes of face-to-face time with the patient.  Greater than 50% of this time was spent in counseling, explanation of diagnosis, planning of further management, and coordination of care.   Thank you for allowing me to participate in patient's care.  If I can answer any additional questions, I would be pleased to do so.    Sincerely,    Marijane Trower K. Posey Pronto, DO

## 2014-03-24 LAB — CERULOPLASMIN: CERULOPLASMIN: 22 mg/dL (ref 18–36)

## 2014-03-24 LAB — COPPER, SERUM: COPPER: 82 ug/dL (ref 70–175)

## 2014-03-25 LAB — VITAMIN B1: Vitamin B1 (Thiamine): 8 nmol/L (ref 8–30)

## 2014-03-27 LAB — VITAMIN B6: Vitamin B6: 3.2 ng/mL (ref 2.1–21.7)

## 2014-04-01 NOTE — Telephone Encounter (Signed)
none

## 2014-04-20 ENCOUNTER — Telehealth: Payer: Self-pay | Admitting: Neurology

## 2014-04-20 NOTE — Telephone Encounter (Signed)
Pt did not want to r/s his appt for 06/24/14 for now. Dr. Posey Pronto is going to be out of the office. Pt stated That he will call first of next month to r/s. Pt also wanted to let Dr.Patel know that he went to an orthopedic doctor yesterday and he  Started to wear a brace for his wrist.

## 2014-04-21 ENCOUNTER — Ambulatory Visit: Payer: BLUE CROSS/BLUE SHIELD | Admitting: Neurology

## 2014-05-30 ENCOUNTER — Telehealth: Payer: Self-pay | Admitting: Nurse Practitioner

## 2014-05-30 ENCOUNTER — Other Ambulatory Visit (HOSPITAL_BASED_OUTPATIENT_CLINIC_OR_DEPARTMENT_OTHER): Payer: BLUE CROSS/BLUE SHIELD

## 2014-05-30 ENCOUNTER — Encounter: Payer: Self-pay | Admitting: Internal Medicine

## 2014-05-30 ENCOUNTER — Other Ambulatory Visit (INDEPENDENT_AMBULATORY_CARE_PROVIDER_SITE_OTHER): Payer: BLUE CROSS/BLUE SHIELD

## 2014-05-30 ENCOUNTER — Ambulatory Visit (HOSPITAL_BASED_OUTPATIENT_CLINIC_OR_DEPARTMENT_OTHER): Payer: BLUE CROSS/BLUE SHIELD | Admitting: Nurse Practitioner

## 2014-05-30 ENCOUNTER — Ambulatory Visit (INDEPENDENT_AMBULATORY_CARE_PROVIDER_SITE_OTHER): Payer: BLUE CROSS/BLUE SHIELD | Admitting: Internal Medicine

## 2014-05-30 VITALS — BP 128/84 | HR 64 | Temp 98.4°F | Resp 16 | Wt 293.0 lb

## 2014-05-30 VITALS — BP 130/95 | HR 66 | Temp 98.0°F | Resp 18 | Ht 73.0 in | Wt 294.8 lb

## 2014-05-30 DIAGNOSIS — E538 Deficiency of other specified B group vitamins: Secondary | ICD-10-CM | POA: Diagnosis not present

## 2014-05-30 DIAGNOSIS — D509 Iron deficiency anemia, unspecified: Secondary | ICD-10-CM

## 2014-05-30 DIAGNOSIS — E038 Other specified hypothyroidism: Secondary | ICD-10-CM | POA: Diagnosis not present

## 2014-05-30 DIAGNOSIS — R599 Enlarged lymph nodes, unspecified: Secondary | ICD-10-CM

## 2014-05-30 DIAGNOSIS — Z85048 Personal history of other malignant neoplasm of rectum, rectosigmoid junction, and anus: Secondary | ICD-10-CM

## 2014-05-30 DIAGNOSIS — E118 Type 2 diabetes mellitus with unspecified complications: Secondary | ICD-10-CM | POA: Diagnosis not present

## 2014-05-30 DIAGNOSIS — E785 Hyperlipidemia, unspecified: Secondary | ICD-10-CM | POA: Diagnosis not present

## 2014-05-30 DIAGNOSIS — C187 Malignant neoplasm of sigmoid colon: Secondary | ICD-10-CM

## 2014-05-30 LAB — LDL CHOLESTEROL, DIRECT: LDL DIRECT: 123 mg/dL

## 2014-05-30 LAB — CBC WITH DIFFERENTIAL/PLATELET
BASO%: 0.5 % (ref 0.0–2.0)
BASOS ABS: 0 10*3/uL (ref 0.0–0.1)
BASOS ABS: 0 10*3/uL (ref 0.0–0.1)
BASOS PCT: 0.4 % (ref 0.0–3.0)
EOS%: 5.2 % (ref 0.0–7.0)
Eosinophils Absolute: 0.3 10*3/uL (ref 0.0–0.5)
Eosinophils Absolute: 0.3 10*3/uL (ref 0.0–0.7)
Eosinophils Relative: 5.5 % — ABNORMAL HIGH (ref 0.0–5.0)
HCT: 42.7 % (ref 38.4–49.9)
HCT: 43.5 % (ref 39.0–52.0)
HEMOGLOBIN: 14.8 g/dL (ref 13.0–17.1)
Hemoglobin: 14.9 g/dL (ref 13.0–17.0)
LYMPH%: 24.5 % (ref 14.0–49.0)
Lymphocytes Relative: 24.2 % (ref 12.0–46.0)
Lymphs Abs: 1.4 10*3/uL (ref 0.7–4.0)
MCH: 29.6 pg (ref 27.2–33.4)
MCHC: 34.7 g/dL (ref 32.0–36.0)
MCHC: 34.1 g/dL (ref 30.0–36.0)
MCV: 85.4 fL (ref 79.3–98.0)
MCV: 85.2 fl (ref 78.0–100.0)
MONO ABS: 0.5 10*3/uL (ref 0.1–1.0)
MONO#: 0.4 10*3/uL (ref 0.1–0.9)
MONO%: 7 % (ref 0.0–14.0)
Monocytes Relative: 8.1 % (ref 3.0–12.0)
NEUT%: 62.8 % (ref 39.0–75.0)
NEUTROS ABS: 3.5 10*3/uL (ref 1.5–6.5)
NEUTROS PCT: 61.8 % (ref 43.0–77.0)
Neutro Abs: 3.6 10*3/uL (ref 1.4–7.7)
PLATELETS: 146 10*3/uL (ref 140–400)
PLATELETS: 151 10*3/uL (ref 150.0–400.0)
RBC: 5 10*6/uL (ref 4.20–5.82)
RBC: 5.11 Mil/uL (ref 4.22–5.81)
RDW: 17.6 % — AB (ref 11.0–14.6)
RDW: 19.8 % — ABNORMAL HIGH (ref 11.5–15.5)
WBC: 5.6 10*3/uL (ref 4.0–10.3)
WBC: 5.8 10*3/uL (ref 4.0–10.5)
lymph#: 1.4 10*3/uL (ref 0.9–3.3)

## 2014-05-30 LAB — COMPREHENSIVE METABOLIC PANEL
ALT: 66 U/L — ABNORMAL HIGH (ref 0–53)
AST: 45 U/L — ABNORMAL HIGH (ref 0–37)
Albumin: 4.1 g/dL (ref 3.5–5.2)
Alkaline Phosphatase: 109 U/L (ref 39–117)
BUN: 13 mg/dL (ref 6–23)
CO2: 28 meq/L (ref 19–32)
CREATININE: 0.87 mg/dL (ref 0.40–1.50)
Calcium: 9.3 mg/dL (ref 8.4–10.5)
Chloride: 99 mEq/L (ref 96–112)
GFR: 98.61 mL/min (ref >60.00)
GLUCOSE: 213 mg/dL — AB (ref 70–99)
POTASSIUM: 3.7 meq/L (ref 3.5–5.1)
Sodium: 134 mEq/L — ABNORMAL LOW (ref 135–145)
Total Bilirubin: 0.5 mg/dL (ref 0.2–1.2)
Total Protein: 7.2 g/dL (ref 6.0–8.3)

## 2014-05-30 LAB — URINALYSIS, MICROSCOPIC - CHCC
Bilirubin (Urine): NEGATIVE
GLUCOSE UR CHCC: 1000 mg/dL
Ketones: NEGATIVE mg/dL
Leukocyte Esterase: NEGATIVE
NITRITE: NEGATIVE
Protein: 100 mg/dL
Specific Gravity, Urine: 1.03 (ref 1.003–1.035)
UROBILINOGEN UR: 0.2 mg/dL (ref 0.2–1)
WBC UA: NEGATIVE (ref 0–2)
pH: 5 (ref 4.6–8.0)

## 2014-05-30 LAB — LIPID PANEL
CHOLESTEROL: 207 mg/dL — AB (ref 0–200)
HDL: 28.1 mg/dL — ABNORMAL LOW (ref >39.00)
NonHDL: 178.9
Total CHOL/HDL Ratio: 7
Triglycerides: 328 mg/dL — ABNORMAL HIGH (ref 0.0–149.0)
VLDL: 65.6 mg/dL — AB (ref 0.0–40.0)

## 2014-05-30 LAB — FERRITIN CHCC: Ferritin: 87 ng/ml (ref 22–316)

## 2014-05-30 LAB — HEMOGLOBIN A1C: Hgb A1c MFr Bld: 9 % — ABNORMAL HIGH (ref 4.6–6.5)

## 2014-05-30 LAB — TSH: TSH: 9.83 u[IU]/mL — ABNORMAL HIGH (ref 0.35–4.50)

## 2014-05-30 NOTE — Telephone Encounter (Signed)
Gave avs & calendar for July °

## 2014-05-30 NOTE — Progress Notes (Signed)
Pre visit review using our clinic review tool, if applicable. No additional management support is needed unless otherwise documented below in the visit note. 

## 2014-05-30 NOTE — Progress Notes (Signed)
   Subjective:    Patient ID: Adam Wilson, male    DOB: 06/17/1965, 49 y.o.   MRN: [id number]  Thyroid Problem Presents for follow-up visit. Symptoms include fatigue and weight gain. Patient reports no anxiety, cold intolerance, constipation, depressed mood, diaphoresis, diarrhea, dry skin, hair loss, heat intolerance, hoarse voice, leg swelling, nail problem, palpitations, tremors, visual change or weight loss. The symptoms have been worsening. Past treatments include levothyroxine. The treatment provided mild relief.      Review of Systems  Constitutional: Positive for weight gain, fatigue and unexpected weight change. Negative for fever, weight loss and diaphoresis.  HENT: Negative.  Negative for hoarse voice.   Respiratory: Negative.  Negative for cough, choking, chest tightness, shortness of breath and stridor.   Cardiovascular: Negative.  Negative for chest pain, palpitations and leg swelling.  Gastrointestinal: Negative.  Negative for nausea, vomiting, abdominal pain, diarrhea and constipation.  Endocrine: Positive for polyphagia. Negative for cold intolerance, heat intolerance, polydipsia and polyuria.  Genitourinary: Negative.   Musculoskeletal: Negative.  Negative for myalgias, back pain, arthralgias and neck pain.  Skin: Negative.  Negative for rash.  Allergic/Immunologic: Negative.   Neurological: Negative.  Negative for tremors.  Hematological: Negative.  Negative for adenopathy. Does not bruise/bleed easily.  Psychiatric/Behavioral: Negative.  The patient is not nervous/anxious.        Objective:   Physical Exam  Constitutional: He is oriented to person, place, and time. He appears well-developed and well-nourished. No distress.  HENT:  Head: Normocephalic and atraumatic.  Mouth/Throat: Oropharynx is clear and moist.  Eyes: Conjunctivae are normal. Right eye exhibits no discharge. Left eye exhibits no discharge. No scleral icterus.  Neck: Normal range of motion. Neck  supple. No JVD present. No tracheal deviation present. No thyromegaly present.  Cardiovascular: Normal rate, regular rhythm, normal heart sounds and intact distal pulses.  Exam reveals no gallop and no friction rub.   No murmur heard. Pulmonary/Chest: Effort normal and breath sounds normal. No stridor. No respiratory distress. He has no wheezes. He has no rales. He exhibits no tenderness.  Abdominal: Soft. Bowel sounds are normal. He exhibits no distension and no mass. There is no tenderness. There is no rebound and no guarding.  Musculoskeletal: Normal range of motion. He exhibits no edema or tenderness.  Lymphadenopathy:    He has no cervical adenopathy.  Neurological: He is oriented to person, place, and time.  Skin: Skin is warm and dry. No rash noted. He is not diaphoretic. No erythema. No pallor.  Vitals reviewed.    Lab Results  Component Value Date   WBC 5.8 05/30/2014   HGB 14.9 05/30/2014   HCT 43.5 05/30/2014   PLT 151.0 05/30/2014   GLUCOSE 213* 05/30/2014   CHOL 207* 05/30/2014   TRIG 328.0* 05/30/2014   HDL 28.10* 05/30/2014   LDLDIRECT 123.0 05/30/2014   LDLCALC 74 05/31/2013   ALT 66* 05/30/2014   AST 45* 05/30/2014   NA 134* 05/30/2014   K 3.7 05/30/2014   CL 99 05/30/2014   CREATININE 0.87 05/30/2014   BUN 13 05/30/2014   CO2 28 05/30/2014   TSH 9.83* 05/30/2014   PSA 0.00 Repeated and verified X2.* 11/11/2011   HGBA1C 9.0* 05/30/2014       Assessment & Plan:

## 2014-05-30 NOTE — Patient Instructions (Signed)
Hypothyroidism The thyroid is a large gland located in the lower front of your neck. The thyroid gland helps control metabolism. Metabolism is how your body handles food. It controls metabolism with the hormone thyroxine. When this gland is underactive (hypothyroid), it produces too little hormone.  CAUSES These include:   Absence or destruction of thyroid tissue.  Goiter due to iodine deficiency.  Goiter due to medications.  Congenital defects (since birth).  Problems with the pituitary. This causes a lack of TSH (thyroid stimulating hormone). This hormone tells the thyroid to turn out more hormone. SYMPTOMS  Lethargy (feeling as though you have no energy)  Cold intolerance  Weight gain (in spite of normal food intake)  Dry skin  Coarse hair  Menstrual irregularity (if severe, may lead to infertility)  Slowing of thought processes Cardiac problems are also caused by insufficient amounts of thyroid hormone. Hypothyroidism in the newborn is cretinism, and is an extreme form. It is important that this form be treated adequately and immediately or it will lead rapidly to retarded physical and mental development. DIAGNOSIS  To prove hypothyroidism, your caregiver may do blood tests and ultrasound tests. Sometimes the signs are hidden. It may be necessary for your caregiver to watch this illness with blood tests either before or after diagnosis and treatment. TREATMENT  Low levels of thyroid hormone are increased by using synthetic thyroid hormone. This is a safe, effective treatment. It usually takes about four weeks to gain the full effects of the medication. After you have the full effect of the medication, it will generally take another four weeks for problems to leave. Your caregiver may start you on low doses. If you have had heart problems the dose may be gradually increased. It is generally not an emergency to get rapidly to normal. HOME CARE INSTRUCTIONS   Take your  medications as your caregiver suggests. Let your caregiver know of any medications you are taking or start taking. Your caregiver will help you with dosage schedules.  As your condition improves, your dosage needs may increase. It will be necessary to have continuing blood tests as suggested by your caregiver.  Report all suspected medication side effects to your caregiver. SEEK MEDICAL CARE IF: Seek medical care if you develop:  Sweating.  Tremulousness (tremors).  Anxiety.  Rapid weight loss.  Heat intolerance.  Emotional swings.  Diarrhea.  Weakness. SEEK IMMEDIATE MEDICAL CARE IF:  You develop chest pain, an irregular heart beat (palpitations), or a rapid heart beat. MAKE SURE YOU:   Understand these instructions.  Will watch your condition.  Will get help right away if you are not doing well or get worse. Document Released: 01/28/2005 Document Revised: 04/22/2011 Document Reviewed: 09/18/2007 ExitCare Patient Information 2015 [employer]. This information is not intended to replace advice given to you by your health care provider. Make sure you discuss any questions you have with your health care provider.  

## 2014-05-30 NOTE — Progress Notes (Signed)
  [facility] OFFICE PROGRESS NOTE   Diagnosis:   Rectal cancer  INTERVAL HISTORY:   Adam Wilson returns as scheduled. He continues oral iron once a day. No change in baseline frequent bowel movements. He notes that stools are black since beginning iron. No known bleeding.  No abdominal pain. He notes less numbness and tingling in his toes with an increased dose of Neurontin. He has a good appetite.  Objective:  Vital signs in last 24 hours:  Blood pressure 130/95, pulse 66, temperature 98 F (36.7 C), temperature source Oral, resp. rate 18, height 6\' 1"  (1.854 m), weight 294 lb 12.8 oz (133.72 kg), SpO2 99 %.    HEENT:  No thrush or ulcers. Lymphatics:  Prominent bilateral axillary fat pads versus soft mobile lymph nodes. 1 cm bilateral inguinal nodes. No palpable cervical or supra-clavicular lymph nodes. Resp:  Lungs clear bilaterally. Cardio:  Regular rate and rhythm. GI:  Abdomen soft and nontender. No hepatomegaly. No mass. Vascular:  No leg edema.   Lab Results:  Lab Results  Component Value Date   WBC 5.6 05/30/2014   HGB 14.8 05/30/2014   HCT 42.7 05/30/2014   MCV 85.4 05/30/2014   PLT 146 05/30/2014   NEUTROABS 3.5 05/30/2014    Imaging:  No results found.  Medications: I have reviewed the patient's current medications.  Assessment/Plan: 1. Rectal cancer, status post neoadjuvant fusional 5-FU and concurrent radiation, followed by low anterior resection and diverting ileostomy, 10/22/2007. He completed 9 cycles of adjuvant FOLFOX chemotherapy 03/28/2008. Restaging CT evaluation 06/19/2009 was negative for evidence of metastatic disease. Restaging CT evaluation 07/16/2010 was negative for evidence of metastatic disease. 2. 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 The neuropathy symptoms have improved. Neurontin did not help his  symptoms. 3. Small bilateral inguinal lymph nodes, 11/28/2008 - Stable. 4. History of thrombocytopenia secondary to chemotherapy.  5. History of iron deficiency anemia-status post an endoscopic evaluation in the spring of 2013: negative EGD, colonoscopy with granulation tissue at the rectum-status post fulguration 06/05/2011. 6. Pelvic lymphadenopathy on CT, 02/19/2007, with no pathologically enlarged lymph nodes on pelvic CT, 07/04/2008, 06/19/2009, and 07/16/2010. 7. History of a vertebral compression fracture.  8. Status post Port-A-Cath removal, 06/282010.  9. Status post ileostomy takedown by Dr. Johney Maine, 04/25/2008. 10. Erectile dysfunction with a low testosterone level, January 2012 - Levitra did not help. Suggested he follow-up with Dr. Ronnald Ramp. 11. Elevated glucose on 07/16/2010 - being followed by Dr. Ronnald Ramp. 12. 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 70. B12 deficiency diagnosed July 2012. He is taking oral B12.   Disposition: Adam Wilson appears stable. He remains in clinical remission from rectal cancer.  The hemoglobin has corrected into normal range. We will follow-up on the ferritin from today. He will discontinue oral iron. Stool Hemoccult cards were negative on 03/15/2014.  We will obtain a urinalysis today to look for hematuria.  He will return for a follow-up visit, CBC and CEA in 3 months  Plan reviewed with Dr. Benay Spice.  [name] ANP/GNP-BC 05/30/2014  2:39 PM

## 2014-05-31 ENCOUNTER — Encounter: Payer: Self-pay | Admitting: Internal Medicine

## 2014-05-31 LAB — CEA: CEA: 1.9 ng/mL (ref 0.0–5.0)

## 2014-05-31 MED ORDER — LEVOTHYROXINE SODIUM 175 MCG PO TABS: 175.0000 ug | ORAL_TABLET | Freq: Every day | ORAL | Status: DC

## 2014-05-31 MED ORDER — LIRAGLUTIDE 18 MG/3ML ~~LOC~~ SOPN: 1.8000 mg | PEN_INJECTOR | Freq: Every day | SUBCUTANEOUS | Status: DC

## 2014-05-31 MED ORDER — METFORMIN HCL ER (MOD) 1000 MG PO TB24: 2000.0000 mg | ORAL_TABLET | Freq: Every day | ORAL | Status: DC

## 2014-05-31 MED ORDER — EMPAGLIFLOZIN 10 MG PO TABS: 10.0000 mg | ORAL_TABLET | Freq: Every day | ORAL | Status: DC

## 2014-05-31 MED ORDER — ATORVASTATIN CALCIUM 20 MG PO TABS: 20.0000 mg | ORAL_TABLET | Freq: Every day | ORAL | Status: DC

## 2014-05-31 NOTE — Assessment & Plan Note (Signed)
His TSh is up and he has some concerning symptoms Will increase his synthroid dose

## 2014-05-31 NOTE — Assessment & Plan Note (Signed)
His LDL is too high and he has not been taking a statin Will start atrovastatin

## 2014-05-31 NOTE — Assessment & Plan Note (Signed)
His A1C is up to 9% Will increase the metformin dose and will add on jardiance and victoza He was also referred for diabetic education

## 2014-06-01 ENCOUNTER — Telehealth: Payer: Self-pay | Admitting: *Deleted

## 2014-06-01 NOTE — Telephone Encounter (Signed)
Called and informed patient of normal cea.  Per Dr. Sherrill.  Patient verbalized understanding.  

## 2014-06-01 NOTE — Telephone Encounter (Signed)
-----   Message from Ladell Pier, MD sent at 05/31/2014  5:45 PM EDT ----- Please call patient, Adam Wilson is normal

## 2014-06-02 ENCOUNTER — Encounter: Payer: Self-pay | Admitting: Internal Medicine

## 2014-06-14 ENCOUNTER — Encounter: Payer: Self-pay | Admitting: Internal Medicine

## 2014-06-14 ENCOUNTER — Telehealth: Payer: Self-pay | Admitting: Internal Medicine

## 2014-06-14 ENCOUNTER — Ambulatory Visit (INDEPENDENT_AMBULATORY_CARE_PROVIDER_SITE_OTHER): Payer: BLUE CROSS/BLUE SHIELD | Admitting: Internal Medicine

## 2014-06-14 VITALS — BP 120/80 | HR 78 | Resp 18 | Ht 73.0 in | Wt 284.0 lb

## 2014-06-14 DIAGNOSIS — E785 Hyperlipidemia, unspecified: Secondary | ICD-10-CM | POA: Diagnosis not present

## 2014-06-14 DIAGNOSIS — E038 Other specified hypothyroidism: Secondary | ICD-10-CM | POA: Diagnosis not present

## 2014-06-14 DIAGNOSIS — R7309 Other abnormal glucose: Secondary | ICD-10-CM

## 2014-06-14 DIAGNOSIS — E118 Type 2 diabetes mellitus with unspecified complications: Secondary | ICD-10-CM | POA: Diagnosis not present

## 2014-06-14 DIAGNOSIS — R739 Hyperglycemia, unspecified: Secondary | ICD-10-CM

## 2014-06-14 LAB — GLUCOSE, POCT (MANUAL RESULT ENTRY): POC GLUCOSE: 355 mg/dL — AB (ref 70–99)

## 2014-06-14 MED ORDER — LIRAGLUTIDE 18 MG/3ML ~~LOC~~ SOPN: 1.8000 mg | PEN_INJECTOR | Freq: Every day | SUBCUTANEOUS | Status: DC

## 2014-06-14 MED ORDER — METFORMIN HCL ER (MOD) 1000 MG PO TB24: 2000.0000 mg | ORAL_TABLET | Freq: Every day | ORAL | Status: DC

## 2014-06-14 MED ORDER — ATORVASTATIN CALCIUM 20 MG PO TABS: 20.0000 mg | ORAL_TABLET | Freq: Every day | ORAL | Status: DC

## 2014-06-14 NOTE — Progress Notes (Signed)
Subjective:    Patient ID: Adam Wilson, male    DOB: 1965-09-12, 49 y.o.   MRN: [id number]  HPI  Here to f/u; pt here with wife, both state have had some communication with PCP issues recently and med affordability issues as well, Also admits dietary noncompliance in last few months, but is willing to go to DM education class.  Both state they were unaware of the increased metformin from 1000 to 2000 per day, crestor changed to lipitor, and victoza rx (has not yet started).  Did understand about the change in thryroid med ok.  Unable to afford Jardiance (or crestor) - never started.,  Pt denies chest pain, increasing sob or doe, wheezing, orthopnea, PND, increased LE swelling, palpitations, dizziness or syncope.  Pt denies new neurological symptoms such as new headache, or facial or extremity weakness or numbness.  Pt denies polydipsia, polyuria, or low sugar episode, but has had some increased urinary freq and wt loss  Denies hyper or hypo thyroid symptoms such as voice, skin or hair change. Wt Readings from Last 3 Encounters:  06/14/14 284 lb (128.822 kg)  05/30/14 293 lb (132.904 kg)  05/30/14 294 lb 12.8 oz (133.72 kg)   Past Medical History  Diagnosis Date  . Rectal carcinoma 2009  . Diabetes mellitus   . 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 Kidney stone   . Colon polyps    Past Surgical History  Procedure Laterality Date  . Colon surgery  2010  . Tonsillectomy    . Portacath placement    . Ileostomy    . Ileostomy closure    . Umbilical hernia repair    . Hernia repair    . Flexible sigmoidoscopy  06/05/2011    Procedure: FLEXIBLE SIGMOIDOSCOPY;  Surgeon: Inda Castle, MD;  Location: [facility];  Service: Endoscopy;  Laterality: N/A;  . Hot hemostasis  06/05/2011    Procedure: HOT HEMOSTASIS (ARGON PLASMA COAGULATION/BICAP);  Surgeon: Inda Castle, MD;  Location: [facility];  Service: Endoscopy;  Laterality: N/A;    reports that he has never smoked. He has  never used smokeless tobacco. He reports that he does not drink alcohol or use illicit drugs. family history includes Arthritis in his mother; Diabetes in his mother; Healthy in his daughter and son; Heart attack in his father; Heart disease in his brother; Hyperlipidemia in his father; Hypertension in his father; Stomach cancer in his paternal grandmother; Stroke in his father. There is no history of Colon cancer, Esophageal cancer, or Rectal cancer. No Known Allergies Current Outpatient Prescriptions on File Prior to Visit  Medication Sig Dispense Refill  . ferrous sulfate 325 (65 FE) MG tablet Take 1 tablet (325 mg total) by mouth 2 (two) times daily with a meal. 180 tablet 3  . gabapentin (NEURONTIN) 300 MG capsule Take 1 capsule (300 mg total) by mouth 3 (three) times daily. 90 capsule 5  . levothyroxine (SYNTHROID) 175 MCG tablet Take 1 tablet (175 mcg total) by mouth daily before breakfast. 90 tablet 1   No current facility-administered medications on file prior to visit.   Review of Systems  Constitutional: Negative for unusual diaphoresis or night sweats HENT: Negative for ringing in ear or discharge Eyes: Negative for double vision or worsening visual disturbance.  Respiratory: Negative for choking and stridor.   Gastrointestinal: Negative for vomiting or other signifcant bowel change Genitourinary: Negative for hematuria or change in urine volume.  Musculoskeletal: Negative for other MSK  pain or swelling Skin: Negative for color change and worsening wound.  Neurological: Negative for tremors and numbness other than noted  Psychiatric/Behavioral: Negative for decreased concentration or agitation other than above       Objective:   Physical Exam BP 120/80 mmHg  Pulse 78  Resp 18  Ht 6\' 1"  (1.854 m)  Wt 284 lb (128.822 kg)  BMI 37.48 kg/m2  SpO2 95% VS noted,  Constitutional: Pt appears in no significant distress HENT: Head: NCAT.  Right Ear: External ear normal.  Left  Ear: External ear normal.  Eyes: . Pupils are equal, round, and reactive to light. Conjunctivae and EOM are normal Neck: Normal range of motion. Neck supple.  Cardiovascular: Normal rate and regular rhythm.   Pulmonary/Chest: Effort normal and breath sounds without rales or wheezing.  Abd:  Soft, NT, ND, + BS Neurological: Pt is alert. Not confused , motor grossly intact Skin: Skin is warm. No rash, no LE edema Psychiatric: Pt behavior is normal. No agitation.      Assessment & Plan:

## 2014-06-14 NOTE — Progress Notes (Signed)
Pre visit review using our clinic review tool, if applicable. No additional management support is needed unless otherwise documented below in the visit note. 

## 2014-06-14 NOTE — Assessment & Plan Note (Signed)
CBG 355 today in office despite metformin 1000 qd only, and better diet the last few days; d/w pt and wife, to work on better diet which has been a significant issue for him, refer to DM education, let him know about the recent increased glumetza to 2000 qd, to start the victoza daily (knows how to do shots), hold on the jardiance since not affordable, check cbg's at least daily, and call for persistent cbg > 200, consider add glipizide ER 5 or 10 mg if above is not helping enough, to also f/u in 3 mo for f/u labs /a1c with PCP

## 2014-06-14 NOTE — Assessment & Plan Note (Signed)
With mild elev tsh recent, and increased synthroid, cont same tx, for f/u lab next visit,  to f/u any worsening symptoms or concerns

## 2014-06-14 NOTE — Telephone Encounter (Signed)
Patient Name: Adam Wilson  DOB: 01-Mar-1965    Initial Comment caller states his BS is 363 this morning and it was 474 over the weekend   Nurse Assessment  Nurse: Mallie Mussel, RN, Alveta Heimlich Date/Time Eilene Ghazi Time): 06/14/2014 1:27:39 PM  Confirm and document reason for call. If symptomatic, describe symptoms. ---Caller states that his blood sugar was 363 this morning. He is at work and cannot check his blood sugar currently, does not have his glucometer. He takes Metformin. He was out of this on Friday and Saturday morning. He takes it only once daily. Denies vomiting. He states he feels fine.  Has the patient traveled out of the country within the last 30 days? ---No  Does the patient require triage? ---Yes  Related visit to physician within the last 2 weeks? ---No  Does the PT have any chronic conditions? (i.e. diabetes, asthma, etc.) ---Yes  List chronic conditions. ---Diabetes Type II     Guidelines    Guideline Title Affirmed Question Affirmed Notes  Diabetes - High Blood Sugar [1] Symptoms of high blood sugar (e.g., frequent urination, weak, weight loss) AND [2] not able to test blood glucose He has been urinating more frequently.   Final Disposition User   See Physician within [place], RN, Alveta Heimlich    Comments  Appointment made today for 3:00pm with Dr. Cathlean Cower.

## 2014-06-14 NOTE — Patient Instructions (Signed)
Please take all new medication as prescribed - the victoza  Ok to continue the metformin at 2000 mg per day   Please continue all other medications as before, and refills have been done if requested - includuing the lipitor  You will be contacted regarding the referral for: Diabetic education class  Please check your blood sugar 1-2 times per day, either before meals or before bedtime (ideally at different time every few days to get a better idea how the sugars do later in the day  Please write these down and bring to your next appointment  Please have the pharmacy call with any other refills you may need.  Please continue your efforts at being more active, diabetic low cholesterol diet, and weight control.  Please keep your appointments with your specialists as you may have planned  Please plan to see Dr Ronnald Ramp in 3 months, for repeat blood work

## 2014-06-14 NOTE — Assessment & Plan Note (Signed)
Ok for lipitor as prescribed since crestor too expensive,  d/w pt , should be addressed with DM education as well Lab Results  Component Value Date   LDLCALC 74 05/31/2013   Goal < 70, for f/u lab next visit

## 2014-06-15 ENCOUNTER — Telehealth: Payer: Self-pay | Admitting: Internal Medicine

## 2014-06-15 NOTE — Telephone Encounter (Signed)
Called pt wife no answer LMOM with md response...[name]

## 2014-06-15 NOTE — Telephone Encounter (Signed)
Patients wife states patient was in office with Dr. Jenny Reichmann yesterday.  States Dr. Jenny Reichmann was wanting him to increase his metformin to 2000mg .  Wife states patient only took 500 mg.  Wife states he has increased metformin to 1000mg .  Wife wants him to increase to a 1000mg  for a week to see how he is doing and if needed to increase to 1500mg .  Would like a call back in regards.  Also states there was an injection that Dr. Ronnald Ramp ordered to bring sugar down.  States insurance does not cover.  States pharmacy is sending over a PA.

## 2014-06-15 NOTE — Telephone Encounter (Signed)
It is okay to it the way she wants to do it

## 2014-06-17 ENCOUNTER — Telehealth: Payer: Self-pay | Admitting: *Deleted

## 2014-06-17 NOTE — Telephone Encounter (Signed)
[place] [facility] - Client [facility] Patient Name: Adam Wilson Gender: Male DOB: 05/03/1965 Age: 49 Y 9 M 1 D Return Phone Number: [phone] (Primary) Address: City/State/Zip: [place] Client [facility] - Client Client Site [place] - Day Physician [place] Type Call Call Type Triage / Clinical Relationship To Patient Self Appointment Disposition EMR Appointment Scheduled Info pasted into Epic Yes Return Phone Number [phone] (Primary) Chief Complaint Blood Sugar High Initial Comment caller states his BS is 363 this morning and it was 474 over the weekend PreDisposition Call Doctor Nurse Assessment Nurse: Mallie Mussel, RN, Alveta Heimlich Date/Time Eilene Ghazi Time): 06/14/2014 1:27:39 PM Confirm and document reason for call. If symptomatic, describe symptoms. ---Caller states that his blood sugar was 363 this morning. He is at work and cannot check his blood sugar currently, does not have his glucometer. He takes Metformin. He was out of this on Friday and Saturday morning. He takes it only once daily. Denies vomiting. He states he feels fine. Has the patient traveled out of the country within the last 30 days? ---No Does the patient require triage? ---Yes Related visit to physician within the last 2 weeks? ---No Does the PT have any chronic conditions? (i.e. diabetes, asthma, etc.) ---Yes List chronic conditions. ---Diabetes Type II Guidelines Guideline Title Affirmed Question Affirmed Notes Nurse Date/Time (Eastern Time) Diabetes - High Blood Sugar [1] Symptoms of high blood sugar (e.g., frequent urination, weak, weight loss) AND [2] not able to test blood glucose He has been urinating more frequently. Mallie Mussel, RN, Alveta Heimlich 06/14/2014 1:30:59 PM Disp. Time Eilene Ghazi Time) Disposition Final User 06/14/2014 1:08:20 PM Send To Clinical Follow Up Jeral Fruit 06/14/2014 1:36:19 PM See  Physician within 24 Hours Yes Mallie Mussel, RN, Alveta Heimlich PLEASE NOTE: All timestamps contained within this report are represented as [place] Standard Time. CONFIDENTIALTY NOTICE: This fax transmission is intended only for the addressee. It contains information that is legally privileged, confidential or otherwise protected from use or disclosure. If you are not the intended recipient, you are strictly prohibited from reviewing, disclosing, copying using or disseminating any of this information or taking any action in reliance on or regarding this information. If you have received this fax in error, please notify us immediately by telephone so that we can arrange for its return to [place]. Phone: [phone], Toll-Free: [phone], Fax: [phone] Page: 2 of 2 Call Id: [id number] Caller Understands: Yes Disagree/Comply: Comply Care Advice Given Per Guideline SEE PHYSICIAN WITHIN 24 HOURS: * Drink at least one glass (8 oz) of water per hour for the next 4 hours (Reason: adequate hydration will reduce hyperglycemia). * Generally, you should try to drink 6-8 glasses of water each day. * Vomiting occurs * Rapid breathing occurs * You become worse. After Care Instructions Given Call Event Type User Date / Time Description

## 2014-06-20 ENCOUNTER — Telehealth: Payer: Self-pay | Admitting: *Deleted

## 2014-06-20 DIAGNOSIS — E118 Type 2 diabetes mellitus with unspecified complications: Secondary | ICD-10-CM

## 2014-06-20 NOTE — Telephone Encounter (Signed)
Also received fax pt needing PA for [facility] notified insurance gave rep clinical information. She stated Pa has been sent for clinical review will be contacted by fax once a decision has been made...[name]

## 2014-06-20 NOTE — Telephone Encounter (Signed)
Received fax pt needing Pa on his Victoza. Contacted insurance fax over PA form has been completed and fax back waiting on approval status...Adam Wilson

## 2014-06-22 MED ORDER — METFORMIN HCL ER 500 MG PO TB24: ORAL_TABLET | ORAL | Status: DC

## 2014-06-22 NOTE — Telephone Encounter (Signed)
Called [facility] to check status on both PA. Glumetza 1000 mg take 2 tablets by mouth daily in am case ID# [id number] & Victoza Pen inject 0.7mls into skin daily case ID# [id number] both meds was denied. It stated has mail letter to md & patient with reasons for denial.../lmb

## 2014-06-22 NOTE — Telephone Encounter (Signed)
Ok to change the glumetza to metformin ER 500 - 4 tabs in am  Please contact [facility] - is there a similar med that is covered?

## 2014-06-24 ENCOUNTER — Ambulatory Visit: Payer: BLUE CROSS/BLUE SHIELD | Admitting: Neurology

## 2014-06-24 MED ORDER — LIRAGLUTIDE 18 MG/3ML ~~LOC~~ SOPN: 1.8000 mg | PEN_INJECTOR | Freq: Every day | SUBCUTANEOUS | Status: DC

## 2014-06-24 NOTE — Telephone Encounter (Signed)
Called [facility] spoke with rep Ubaldo Glassing she has confirmed that the victoza was approved up under case ID [id number]. Pt pick up medicine on Wednesday 06/22/14. Added victoza back to med list.../lmb

## 2014-07-26 ENCOUNTER — Ambulatory Visit: Payer: BC Managed Care – PPO | Admitting: Oncology

## 2014-07-26 ENCOUNTER — Other Ambulatory Visit: Payer: BC Managed Care – PPO

## 2014-08-10 ENCOUNTER — Encounter: Payer: Self-pay | Admitting: Gastroenterology

## 2014-08-29 ENCOUNTER — Other Ambulatory Visit (HOSPITAL_BASED_OUTPATIENT_CLINIC_OR_DEPARTMENT_OTHER): Payer: BLUE CROSS/BLUE SHIELD

## 2014-08-29 ENCOUNTER — Telehealth: Payer: Self-pay | Admitting: Neurology

## 2014-08-29 ENCOUNTER — Other Ambulatory Visit: Payer: Self-pay | Admitting: *Deleted

## 2014-08-29 ENCOUNTER — Telehealth: Payer: Self-pay | Admitting: Oncology

## 2014-08-29 ENCOUNTER — Ambulatory Visit (HOSPITAL_BASED_OUTPATIENT_CLINIC_OR_DEPARTMENT_OTHER): Payer: BLUE CROSS/BLUE SHIELD | Admitting: Oncology

## 2014-08-29 VITALS — BP 106/71 | HR 54 | Temp 97.5°F | Resp 19 | Ht 73.0 in | Wt 275.0 lb

## 2014-08-29 DIAGNOSIS — D509 Iron deficiency anemia, unspecified: Secondary | ICD-10-CM

## 2014-08-29 DIAGNOSIS — C187 Malignant neoplasm of sigmoid colon: Secondary | ICD-10-CM

## 2014-08-29 DIAGNOSIS — Z85048 Personal history of other malignant neoplasm of rectum, rectosigmoid junction, and anus: Secondary | ICD-10-CM | POA: Diagnosis not present

## 2014-08-29 LAB — CBC WITH DIFFERENTIAL/PLATELET
BASO%: 0.5 % (ref 0.0–2.0)
Basophils Absolute: 0 10*3/uL (ref 0.0–0.1)
EOS%: 4.4 % (ref 0.0–7.0)
Eosinophils Absolute: 0.3 10*3/uL (ref 0.0–0.5)
HCT: 43.5 % (ref 38.4–49.9)
HEMOGLOBIN: 14.6 g/dL (ref 13.0–17.1)
LYMPH%: 20.4 % (ref 14.0–49.0)
MCH: 30.4 pg (ref 27.2–33.4)
MCHC: 33.6 g/dL (ref 32.0–36.0)
MCV: 90.4 fL (ref 79.3–98.0)
MONO#: 0.5 10*3/uL (ref 0.1–0.9)
MONO%: 9 % (ref 0.0–14.0)
NEUT#: 3.9 10*3/uL (ref 1.5–6.5)
NEUT%: 65.7 % (ref 39.0–75.0)
Platelets: 174 10*3/uL (ref 140–400)
RBC: 4.81 10*6/uL (ref 4.20–5.82)
RDW: 13.3 % (ref 11.0–14.6)
WBC: 6 10*3/uL (ref 4.0–10.3)
lymph#: 1.2 10*3/uL (ref 0.9–3.3)

## 2014-08-29 LAB — FERRITIN CHCC: Ferritin: 53 ng/ml (ref 22–316)

## 2014-08-29 MED ORDER — GABAPENTIN 300 MG PO CAPS: 300.0000 mg | ORAL_CAPSULE | Freq: Three times a day (TID) | ORAL | Status: AC

## 2014-08-29 NOTE — Telephone Encounter (Signed)
Rx sent. Patient notified. 

## 2014-08-29 NOTE — Telephone Encounter (Signed)
Gave and printed appt sched and avs for pt for Jan and July 2017

## 2014-08-29 NOTE — Telephone Encounter (Signed)
Pt wants to know if wwe can call in the Neuronatin  To [facility] and call him and  let him know how much [phone]

## 2014-08-29 NOTE — Progress Notes (Signed)
  [facility] OFFICE PROGRESS NOTE   Diagnosis: Rectal cancer  INTERVAL HISTORY:   Mr. Cicalese returns as scheduled. He feels well. He has noted significant improvement in neuropathy symptoms since starting gabapentin. He has lost weight intentionally with a change in his diet. He is working on better control of diabetes. He is no longer taking iron.  Objective:  Vital signs in last 24 hours:  Blood pressure 106/71, pulse 54, temperature 97.5 F (36.4 C), temperature source Oral, resp. rate 19, height 6\' 1"  (1.854 m), weight 275 lb (124.739 kg), SpO2 100 %.    HEENT: Neck without mass Lymphatics: No cervical, supraclavicular, or axillary nodes. Prominent bilateral axillary fat pads on the right greater than left, prominent soft mobile nodular tissue at the lateral inguinal area bilaterally Resp: Lungs clear bilaterally Cardio: Regular rate and rhythm GI: No hepatosplenomegaly, nontender, no mass Vascular: No leg edema  Lab Results:  Lab Results  Component Value Date   WBC 6.0 08/29/2014   HGB 14.6 08/29/2014   HCT 43.5 08/29/2014   MCV 90.4 08/29/2014   PLT 174 08/29/2014   NEUTROABS 3.9 08/29/2014      Lab Results  Component Value Date   CEA 1.9 05/30/2014    Medications: I have reviewed the patient's current medications.  Assessment/Plan: 1. Rectal cancer, status post neoadjuvant fusional 5-FU and concurrent radiation, followed by low anterior resection and diverting ileostomy, 10/22/2007. He completed 9 cycles of adjuvant FOLFOX chemotherapy 03/28/2008. Restaging CT evaluation 06/19/2009 was negative for evidence of metastatic disease. Restaging CT evaluation 07/16/2010 was negative for evidence of metastatic disease. 2. 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 The neuropathy symptoms have improved. Neurontin did not help his  symptoms. 3. Small bilateral inguinal lymph nodes, 11/28/2008 - Stable. 4. History of thrombocytopenia secondary to chemotherapy.  5. History of iron deficiency anemia-status post an endoscopic evaluation in the spring of 2013: negative EGD, colonoscopy with granulation tissue at the rectum-status post fulguration 06/05/2011. 6. Pelvic lymphadenopathy on CT, 02/19/2007, with no pathologically enlarged lymph nodes on pelvic CT, 07/04/2008, 06/19/2009, and 07/16/2010. 7. History of a vertebral compression fracture.  8. Status post Port-A-Cath removal, 06/282010.  9. Status post ileostomy takedown by Dr. Johney Maine, 04/25/2008. 10. Erectile dysfunction with a low testosterone level, January 2012 - Levitra did not help. Suggested he follow-up with Dr. Ronnald Ramp. 11. Diabetes -  followed by Dr. Ronnald Ramp. 12. Neuropathy symptoms in the hands-potentially carpal tunnel, improved with gabapentin 13. B12 deficiency diagnosed July 2012. He is taking oral B12.   Disposition:  Mr. Norem remains in clinical remission from rectal cancer. The hemoglobin remains in the normal range off of iron therapy. He will return for a CBC in 3 months and 6 months. He would like to continue follow-up at the [facility]. Mr. Pelc will be scheduled for an office visit in one year. He continues colonoscopy follow-up with Dr. Deatra Ina.  We will have him return for a urinalysis since no source for blood loss was found when he was evaluated for iron deficiency anemia.  Betsy Coder, MD  08/29/2014  3:34 PM

## 2014-08-30 LAB — CEA: CEA: 1.3 ng/mL (ref 0.0–5.0)

## 2014-09-01 ENCOUNTER — Other Ambulatory Visit: Payer: Self-pay | Admitting: Geriatric Medicine

## 2014-09-01 DIAGNOSIS — E038 Other specified hypothyroidism: Secondary | ICD-10-CM

## 2014-09-01 MED ORDER — LEVOTHYROXINE SODIUM 175 MCG PO TABS: 175.0000 ug | ORAL_TABLET | Freq: Every day | ORAL | Status: AC

## 2014-09-14 ENCOUNTER — Telehealth: Payer: Self-pay | Admitting: *Deleted

## 2014-09-14 ENCOUNTER — Telehealth: Payer: Self-pay | Admitting: Oncology

## 2014-09-14 NOTE — Telephone Encounter (Signed)
Lft msg for pt confirming labs added per 08/03 POF, mailed schedule to pt... KJ

## 2014-09-14 NOTE — Telephone Encounter (Signed)
Called pt: Dr. Benay Spice recommends checking labs in Oct to check for bleeding. There was no explanation for iron deficiency last year. Pt voiced understanding. Requests Mon afternoon appointment. Order to schedulers.

## 2014-09-26 ENCOUNTER — Ambulatory Visit: Payer: BLUE CROSS/BLUE SHIELD | Admitting: Internal Medicine

## 2014-11-28 ENCOUNTER — Other Ambulatory Visit: Payer: BLUE CROSS/BLUE SHIELD

## 2014-11-28 ENCOUNTER — Telehealth: Payer: Self-pay | Admitting: Oncology

## 2014-11-28 ENCOUNTER — Telehealth: Payer: Self-pay | Admitting: *Deleted

## 2014-11-28 NOTE — Telephone Encounter (Signed)
FYI  Voicemail from patient that he cannot keep today's lab appointment at 3:30 pm.  Called informing him appointment is at 4:00.  Still can't keep appointment.  "Would like whatever is available next week or the following week."  Observed needs labs at 3 months and 6 months out from 08-29-2014 F/U visit.    P.O.F. Generated.

## 2014-11-28 NOTE — Telephone Encounter (Signed)
Canceled today's appointment. Order entered for lab in 1-2 weeks.

## 2014-11-28 NOTE — Telephone Encounter (Signed)
Called and left a message with a new lab appointment °

## 2014-11-29 ENCOUNTER — Other Ambulatory Visit: Payer: BLUE CROSS/BLUE SHIELD

## 2014-11-29 NOTE — Telephone Encounter (Signed)
This nurse called patient, message left on voicemail that lab appointment has been rescheduled for Monday, 12-05-2014 at 10:30 am

## 2014-12-05 ENCOUNTER — Other Ambulatory Visit (HOSPITAL_BASED_OUTPATIENT_CLINIC_OR_DEPARTMENT_OTHER): Payer: BLUE CROSS/BLUE SHIELD

## 2014-12-05 DIAGNOSIS — Z85048 Personal history of other malignant neoplasm of rectum, rectosigmoid junction, and anus: Secondary | ICD-10-CM | POA: Diagnosis not present

## 2014-12-05 DIAGNOSIS — C187 Malignant neoplasm of sigmoid colon: Secondary | ICD-10-CM

## 2014-12-05 DIAGNOSIS — D509 Iron deficiency anemia, unspecified: Secondary | ICD-10-CM

## 2014-12-05 LAB — URINALYSIS, MICROSCOPIC - CHCC
Bilirubin (Urine): NEGATIVE
Glucose: NEGATIVE mg/dL
KETONES: NEGATIVE mg/dL
Leukocyte Esterase: NEGATIVE
Nitrite: NEGATIVE
PH: 6.5 (ref 4.6–8.0)
Protein: <30 mg/dL
Specific Gravity, Urine: 1.02 (ref 1.003–1.035)
Urobilinogen, UR: 0.2 mg/dL (ref 0.2–1)
WBC, UA: NEGATIVE (ref 0–2)

## 2014-12-05 LAB — CBC WITH DIFFERENTIAL/PLATELET
BASO%: 0.9 % (ref 0.0–2.0)
Basophils Absolute: 0 10*3/uL (ref 0.0–0.1)
EOS%: 5.6 % (ref 0.0–7.0)
Eosinophils Absolute: 0.3 10*3/uL (ref 0.0–0.5)
HCT: 43.5 % (ref 38.4–49.9)
HGB: 14.6 g/dL (ref 13.0–17.1)
LYMPH%: 21 % (ref 14.0–49.0)
MCH: 29.4 pg (ref 27.2–33.4)
MCHC: 33.6 g/dL (ref 32.0–36.0)
MCV: 87.4 fL (ref 79.3–98.0)
MONO#: 0.4 10*3/uL (ref 0.1–0.9)
MONO%: 8.1 % (ref 0.0–14.0)
NEUT#: 3.3 10*3/uL (ref 1.5–6.5)
NEUT%: 64.4 % (ref 39.0–75.0)
PLATELETS: 158 10*3/uL (ref 140–400)
RBC: 4.98 10*6/uL (ref 4.20–5.82)
RDW: 14.3 % (ref 11.0–14.6)
WBC: 5.1 10*3/uL (ref 4.0–10.3)
lymph#: 1.1 10*3/uL (ref 0.9–3.3)

## 2014-12-06 LAB — CEA: CEA: 1.1 ng/mL (ref 0.0–5.0)

## 2014-12-15 ENCOUNTER — Telehealth: Payer: Self-pay | Admitting: *Deleted

## 2014-12-15 ENCOUNTER — Telehealth: Payer: Self-pay | Admitting: Nurse Practitioner

## 2014-12-15 ENCOUNTER — Telehealth: Payer: Self-pay

## 2014-12-15 ENCOUNTER — Other Ambulatory Visit: Payer: Self-pay | Admitting: Nurse Practitioner

## 2014-12-15 DIAGNOSIS — R319 Hematuria, unspecified: Secondary | ICD-10-CM

## 2014-12-15 DIAGNOSIS — C187 Malignant neoplasm of sigmoid colon: Secondary | ICD-10-CM

## 2014-12-15 NOTE — Telephone Encounter (Signed)
As per Adam Wilson call placed to Adam Wilson to inform him that a referal was made to urology for evaluation of hematuria. Adam Wilson stated he already talked to scheduler who then transferred him to Adam Wilson who explained to him why he was going to see the Urologist. He inquired about not being able to see the Urologist till January gave him the number to [facility] [phone] reiterated what Adam Wilson had informed him to do. Adam Wilson verbalized understanding.

## 2014-12-15 NOTE — Telephone Encounter (Signed)
-----   Message from Owens Shark, NP sent at 12/15/2014 11:35 AM EDT ----- Please make Mr. Disney aware we made a referral to urology for evaluation of hematuria.

## 2014-12-15 NOTE — Telephone Encounter (Signed)
Called [facility] and he is a patient of dr dahlstdt-he hasnt seen him since 2011 and so he will be a new patient,other doctors there are booked until the end of the year and so the patient is aware to call back in town weeks to get on dr Caro Hight schedule.  Also patient knew nothing about needing this visit and i have transferred him to the desk nurse

## 2014-12-15 NOTE — Telephone Encounter (Signed)
Call from pt asking why he needs to see urology. Informed him UA still showed trace blood. Needs evaluation to determine cause of bleeding. He voiced understanding. Instructed him to call urology for sooner appointment if he notices any blood in urine.

## 2015-02-27 ENCOUNTER — Other Ambulatory Visit (HOSPITAL_BASED_OUTPATIENT_CLINIC_OR_DEPARTMENT_OTHER): Payer: BLUE CROSS/BLUE SHIELD

## 2015-02-27 DIAGNOSIS — C187 Malignant neoplasm of sigmoid colon: Secondary | ICD-10-CM

## 2015-02-27 DIAGNOSIS — D509 Iron deficiency anemia, unspecified: Secondary | ICD-10-CM | POA: Diagnosis not present

## 2015-02-27 LAB — CBC WITH DIFFERENTIAL/PLATELET
BASO%: 0.8 % (ref 0.0–2.0)
BASOS ABS: 0.1 10*3/uL (ref 0.0–0.1)
EOS ABS: 0.4 10*3/uL (ref 0.0–0.5)
EOS%: 5.7 % (ref 0.0–7.0)
HEMATOCRIT: 42.5 % (ref 38.4–49.9)
HEMOGLOBIN: 14.8 g/dL (ref 13.0–17.1)
LYMPH#: 1.3 10*3/uL (ref 0.9–3.3)
LYMPH%: 19.7 % (ref 14.0–49.0)
MCH: 30.6 pg (ref 27.2–33.4)
MCHC: 34.8 g/dL (ref 32.0–36.0)
MCV: 88 fL (ref 79.3–98.0)
MONO#: 0.5 10*3/uL (ref 0.1–0.9)
MONO%: 7.7 % (ref 0.0–14.0)
NEUT%: 66.1 % (ref 39.0–75.0)
NEUTROS ABS: 4.3 10*3/uL (ref 1.5–6.5)
PLATELETS: 161 10*3/uL (ref 140–400)
RBC: 4.83 10*6/uL (ref 4.20–5.82)
RDW: 13.8 % (ref 11.0–14.6)
WBC: 6.5 10*3/uL (ref 4.0–10.3)

## 2015-05-08 ENCOUNTER — Emergency Department (HOSPITAL_COMMUNITY)
Admission: EM | Admit: 2015-05-08 | Discharge: 2015-05-08 | Disposition: A | Discharge status: Home / Self Care | Payer: BLUE CROSS/BLUE SHIELD | Attending: Emergency Medicine | Admitting: Emergency Medicine

## 2015-05-08 ENCOUNTER — Telehealth: Payer: Self-pay | Admitting: Gastroenterology

## 2015-05-08 ENCOUNTER — Emergency Department (HOSPITAL_COMMUNITY): Payer: BLUE CROSS/BLUE SHIELD

## 2015-05-08 ENCOUNTER — Encounter (HOSPITAL_COMMUNITY): Payer: Self-pay | Admitting: Emergency Medicine

## 2015-05-08 DIAGNOSIS — Z7984 Long term (current) use of oral hypoglycemic drugs: Secondary | ICD-10-CM | POA: Diagnosis not present

## 2015-05-08 DIAGNOSIS — K602 Anal fissure, unspecified: Secondary | ICD-10-CM | POA: Diagnosis not present

## 2015-05-08 DIAGNOSIS — Z9221 Personal history of antineoplastic chemotherapy: Secondary | ICD-10-CM | POA: Diagnosis not present

## 2015-05-08 DIAGNOSIS — Z9889 Other specified postprocedural states: Secondary | ICD-10-CM | POA: Diagnosis not present

## 2015-05-08 DIAGNOSIS — Z85048 Personal history of other malignant neoplasm of rectum, rectosigmoid junction, and anus: Secondary | ICD-10-CM | POA: Insufficient documentation

## 2015-05-08 DIAGNOSIS — G620 Drug-induced polyneuropathy: Secondary | ICD-10-CM | POA: Diagnosis not present

## 2015-05-08 DIAGNOSIS — E119 Type 2 diabetes mellitus without complications: Secondary | ICD-10-CM | POA: Insufficient documentation

## 2015-05-08 DIAGNOSIS — Z79899 Other long term (current) drug therapy: Secondary | ICD-10-CM | POA: Insufficient documentation

## 2015-05-08 DIAGNOSIS — Z86010 Personal history of colonic polyps: Secondary | ICD-10-CM | POA: Insufficient documentation

## 2015-05-08 DIAGNOSIS — Z87442 Personal history of urinary calculi: Secondary | ICD-10-CM | POA: Diagnosis not present

## 2015-05-08 DIAGNOSIS — T50905A Adverse effect of unspecified drugs, medicaments and biological substances, initial encounter: Secondary | ICD-10-CM | POA: Diagnosis not present

## 2015-05-08 DIAGNOSIS — Z923 Personal history of irradiation: Secondary | ICD-10-CM | POA: Insufficient documentation

## 2015-05-08 DIAGNOSIS — K625 Hemorrhage of anus and rectum: Secondary | ICD-10-CM | POA: Diagnosis present

## 2015-05-08 LAB — CBC
HEMATOCRIT: 42.3 % (ref 39.0–52.0)
HEMOGLOBIN: 15 g/dL (ref 13.0–17.0)
MCH: 31.1 pg (ref 26.0–34.0)
MCHC: 35.5 g/dL (ref 30.0–36.0)
MCV: 87.8 fL (ref 78.0–100.0)
Platelets: 205 10*3/uL (ref 150–400)
RBC: 4.82 MIL/uL (ref 4.22–5.81)
RDW: 13.7 % (ref 11.5–15.5)
WBC: 6.4 10*3/uL (ref 4.0–10.5)

## 2015-05-08 LAB — COMPREHENSIVE METABOLIC PANEL
ALBUMIN: 4 g/dL (ref 3.5–5.0)
ALT: 76 U/L — ABNORMAL HIGH (ref 17–63)
ANION GAP: 9 (ref 5–15)
AST: 42 U/L — AB (ref 15–41)
Alkaline Phosphatase: 94 U/L (ref 38–126)
BILIRUBIN TOTAL: 0.5 mg/dL (ref 0.3–1.2)
BUN: 14 mg/dL (ref 6–20)
CHLORIDE: 102 mmol/L (ref 101–111)
CO2: 24 mmol/L (ref 22–32)
Calcium: 9.3 mg/dL (ref 8.9–10.3)
Creatinine, Ser: 0.94 mg/dL (ref 0.61–1.24)
GFR calc Af Amer: >60 mL/min (ref >60)
GFR calc non Af Amer: >60 mL/min (ref >60)
GLUCOSE: 223 mg/dL — AB (ref 65–99)
POTASSIUM: 4 mmol/L (ref 3.5–5.1)
SODIUM: 135 mmol/L (ref 135–145)
TOTAL PROTEIN: 7.4 g/dL (ref 6.5–8.1)

## 2015-05-08 LAB — TYPE AND SCREEN
ABO/RH(D): B POS
ANTIBODY SCREEN: POSITIVE
DAT, IGG: NEGATIVE

## 2015-05-08 MED ORDER — OXYCODONE-ACETAMINOPHEN 5-325 MG PO TABS
2.0000 | ORAL_TABLET | Freq: Once | ORAL | Status: AC
  Administered 2015-05-08: 2 via ORAL
  Filled 2015-05-08: qty 2

## 2015-05-08 MED ORDER — NITROGLYCERIN 0.4 % RE OINT: TOPICAL_OINTMENT | RECTAL | Status: AC

## 2015-05-08 MED ORDER — DOCUSATE SODIUM 100 MG PO CAPS: 100.0000 mg | ORAL_CAPSULE | Freq: Two times a day (BID) | ORAL | Status: AC

## 2015-05-08 MED ORDER — IOPAMIDOL (ISOVUE-300) INJECTION 61%
100.0000 mL | Freq: Once | INTRAVENOUS | Status: AC | PRN
  Administered 2015-05-08: 100 mL via INTRAVENOUS

## 2015-05-08 MED ORDER — OXYCODONE-ACETAMINOPHEN 5-325 MG PO TABS: 2.0000 | ORAL_TABLET | ORAL | Status: AC | PRN

## 2015-05-08 MED ORDER — IOHEXOL 300 MG/ML  SOLN: 25.0000 mL | Freq: Once | INTRAMUSCULAR | Status: DC | PRN

## 2015-05-08 NOTE — ED Notes (Signed)
Bed: WA07 Expected date:  Expected time:  Means of arrival:  Comments: 

## 2015-05-08 NOTE — ED Provider Notes (Signed)
CSN: [id number]     Arrival date & time 05/08/15  1303 History   First MD Initiated Contact with Patient 05/08/15 1714     Chief Complaint  Patient presents with  . GI Bleeding  . Abdominal Pain     (Consider location/radiation/quality/duration/timing/severity/associated sxs/prior Treatment) HPI Comments: Patient here complaining of bright red blood per rectum times several days. History of rectal carcinoma diagnosed several years ago he says that was stage III. He had radiation, chemotherapy, as well as colostomy. Has had yearly checkups since he was diagnosed back in 2009. Has had some vague abdominal discomfort without fever or vomiting. Has noted some blood mixed in his stool. Complains of pain in his anus is worse with defecation. Has used over-the-counter medications without relief.  Patient is a 50 y.o. male presenting with abdominal pain. The history is provided by the patient.  Abdominal Pain   Past Medical History  Diagnosis Date  . Rectal carcinoma ([place]) 2009  . Diabetes mellitus   . 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 Kidney stone   . Colon polyps    Past Surgical History  Procedure Laterality Date  . Colon surgery  2010  . Tonsillectomy    . Portacath placement    . Ileostomy    . Ileostomy closure    . Umbilical hernia repair    . Hernia repair    . Flexible sigmoidoscopy  06/05/2011    Procedure: FLEXIBLE SIGMOIDOSCOPY;  Surgeon: Inda Castle, MD;  Location: [facility];  Service: Endoscopy;  Laterality: N/A;  . Hot hemostasis  06/05/2011    Procedure: HOT HEMOSTASIS (ARGON PLASMA COAGULATION/BICAP);  Surgeon: Inda Castle, MD;  Location: [facility];  Service: Endoscopy;  Laterality: N/A;   Family History  Problem Relation Age of Onset  . Arthritis Mother   . Diabetes Mother   . Hyperlipidemia Father   . Stroke Father   . Hypertension Father   . Heart attack Father   . Stomach cancer Paternal Grandmother   . Colon cancer Neg Hx   .  Esophageal cancer Neg Hx   . Rectal cancer Neg Hx   . Heart disease Brother   . Healthy Son   . Healthy Daughter    Social History  Substance Use Topics  . Smoking status: Never Smoker   . Smokeless tobacco: Never Used  . Alcohol Use: No     Comment: social    Review of Systems  Gastrointestinal: Positive for abdominal pain.  All other systems reviewed and are negative.     Allergies  Review of patient's allergies indicates no known allergies.  Home Medications   Prior to Admission medications   Medication Sig Start Date End Date Taking? Authorizing Provider  gabapentin (NEURONTIN) 300 MG capsule Take 1 capsule (300 mg total) by mouth 3 (three) times daily. Patient taking differently: Take 300-600 mg by mouth See admin instructions. Take 600 mg every morning and 300 mg every night. 08/29/14  Yes Alda Berthold, DO  levothyroxine (SYNTHROID) 175 MCG tablet Take 1 tablet (175 mcg total) by mouth daily before breakfast. 09/01/14  Yes Janith Lima, MD  metFORMIN (GLUCOPHAGE-XR) 500 MG 24 hr tablet 4 tabs by mouth each am 06/22/14  Yes Biagio Borg, MD   BP 130/91 mmHg  Pulse 88  Temp(Src) 98.1 F (36.7 C) (Oral)  Resp 16  SpO2 100% Physical Exam  Constitutional: He is oriented to person, place, and time. He appears  well-developed and well-nourished.  Non-toxic appearance. No distress.  HENT:  Head: Normocephalic and atraumatic.  Eyes: Conjunctivae, EOM and lids are normal. Pupils are equal, round, and reactive to light.  Neck: Normal range of motion. Neck supple. No tracheal deviation present. No thyroid mass present.  Cardiovascular: Normal rate, regular rhythm and normal heart sounds.  Exam reveals no gallop.   No murmur heard. Pulmonary/Chest: Effort normal and breath sounds normal. No stridor. No respiratory distress. He has no decreased breath sounds. He has no wheezes. He has no rhonchi. He has no rales.  Abdominal: Soft. Normal appearance and bowel sounds are normal.  He exhibits no distension. There is generalized tenderness. There is no rigidity, no rebound, no guarding and no CVA tenderness.  Genitourinary: Rectal exam shows tenderness. Rectal exam shows no external hemorrhoid, no fissure, no mass and anal tone normal.  Musculoskeletal: Normal range of motion. He exhibits no edema or tenderness.  Neurological: He is alert and oriented to person, place, and time. He has normal strength. No cranial nerve deficit or sensory deficit. GCS eye subscore is 4. GCS verbal subscore is 5. GCS motor subscore is 6.  Skin: Skin is warm and dry. No abrasion and no rash noted.  Psychiatric: He has a normal mood and affect. His speech is normal and behavior is normal.  Nursing note and vitals reviewed.   ED Course  Procedures (including critical care time) Labs Review Labs Reviewed  COMPREHENSIVE METABOLIC PANEL - Abnormal; Notable for the following:    Glucose, Bld 223 (*)    AST 42 (*)    ALT 76 (*)    All other components within normal limits  CBC  POC OCCULT BLOOD, ED  TYPE AND SCREEN    Imaging Review No results found. I have personally reviewed and evaluated these images and lab results as part of my medical decision-making.   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 Suspect that patient has anal fissure given his history of long sitting on the toilet when he has bowel movements. Patient also encouraged to follow-up with his gastroenterologist    Lacretia Leigh, MD 05/08/15 1932

## 2015-05-08 NOTE — ED Notes (Signed)
Patient was alert, oriented and stable upon discharge. RN went over AVS and patient had no further questions.  

## 2015-05-08 NOTE — ED Notes (Signed)
Pt with hx of rectal cancer c/o mild/moderate bright red rectal bleeding, left upper quadrant abdominal pain, nausea onset over weekend. Pain unrelated to food.

## 2015-05-08 NOTE — Discharge Instructions (Signed)
Anal Fissure, Adult °An anal fissure is a small tear or crack in the skin around the anus. Bleeding from a fissure usually stops on its own within a few minutes. However, bleeding will often occur again with each bowel movement until the crack heals. °CAUSES °This condition may be caused by: °· Passing large, hard stool (feces). °· Frequent diarrhea. °· Constipation. °· Inflammatory bowel disease (Crohn disease or ulcerative colitis). °· Infections. °· Anal sex. °SYMPTOMS °Symptoms of this condition include: °· Bleeding from the rectum. °· Small amounts of blood seen on your stool, on toilet paper, or in the toilet after a bowel movement. °· Painful bowel movements. °· Itching or irritation around the anus. °DIAGNOSIS  °A health care provider may diagnose this condition by closely examining the anal area. An anal fissure can usually be seen with careful inspection. In some cases, a rectal exam may be performed, or a short tube (anoscope) may be used to examine the anal canal. °TREATMENT °Treatment for this condition may include: °· Taking steps to avoid constipation. This may include making changes to your diet, such as increasing your intake of fiber or fluid. °· Taking fiber supplements. These supplements can soften your stool to help make bowel movements easier. Your health care provider may also prescribe a stool softener if your stool is often hard. °· Taking sitz baths. This may help to heal the tear. °· Using medicated creams or ointments. These may be prescribed to lessen discomfort. °HOME CARE INSTRUCTIONS °Eating and Drinking °· Avoid foods that may be constipating, such as bananas and dairy products. °· Drink enough fluid to keep your urine clear or pale yellow. °· Maintain a diet that is high in fruits, whole grains, and vegetables. °General Instructions °· Keep the anal area as clean and dry as possible. °· Take sitz baths as told by your health care provider. Do not use soap in the sitz baths. °· Take  over-the-counter and prescription medicines only as told by your health care provider. °· Use creams or ointments only as told by your health care provider. °· Keep all follow-up visits as told by your health care provider. This is important. °SEEK MEDICAL CARE IF: °· You have more bleeding. °· You have a fever. °· You have diarrhea that is mixed with blood. °· You continue to have pain. °· Your problem is getting worse rather than better. °  °This information is not intended to replace advice given to you by your health care provider. Make sure you discuss any questions you have with your health care provider. °  °Document Released: 01/28/2005 Document Revised: 10/19/2014 Document Reviewed: 04/25/2014 °Elsevier Interactive Patient Education ©2016 Elsevier Inc. ° °

## 2015-05-08 NOTE — Telephone Encounter (Signed)
Patient has presented at the ER.

## 2015-05-09 NOTE — Telephone Encounter (Signed)
Pt was seen in ED - they have advised he needs to be seen for his fissure. Needs to know if he needs to go to surgeon or have colonoscopy.

## 2015-05-09 NOTE — Telephone Encounter (Signed)
The ER physician note states follow up with GI. Appointment tomorrow at 2:00 pm with Nicoletta Ba, PA-C scheduled. Got v/m when calling back. Left a message advising of the appointment and requesting confirmation.

## 2015-05-10 ENCOUNTER — Ambulatory Visit (INDEPENDENT_AMBULATORY_CARE_PROVIDER_SITE_OTHER): Payer: BLUE CROSS/BLUE SHIELD | Admitting: Physician Assistant

## 2015-05-10 ENCOUNTER — Encounter: Payer: Self-pay | Admitting: Physician Assistant

## 2015-05-10 VITALS — BP 102/80 | HR 70 | Ht 73.0 in | Wt 280.0 lb

## 2015-05-10 DIAGNOSIS — K625 Hemorrhage of anus and rectum: Secondary | ICD-10-CM

## 2015-05-10 DIAGNOSIS — Z85048 Personal history of other malignant neoplasm of rectum, rectosigmoid junction, and anus: Secondary | ICD-10-CM | POA: Diagnosis not present

## 2015-05-10 DIAGNOSIS — K6289 Other specified diseases of anus and rectum: Secondary | ICD-10-CM

## 2015-05-10 MED ORDER — NA SULFATE-K SULFATE-MG SULF 17.5-3.13-1.6 GM/177ML PO SOLN: 1.0000 | Freq: Once | ORAL | Status: DC

## 2015-05-10 MED ORDER — DILTIAZEM GEL 2 %: CUTANEOUS | Status: AC

## 2015-05-10 NOTE — Progress Notes (Signed)
Patient ID: Adam Wilson, male   DOB: 02-Aug-1965, 50 y.o.   MRN: [id number]   Subjective:    Patient ID: Adam Wilson, male    DOB: 04/30/1965, 50 y.o.   MRN: [id number]  HPI  [name] is a pleasant 50 year old white male former patient of Dr. Kelby Fam who comes in today with complaints of anal rectal pain and a small amount of rectal bleeding. Patient has history of stage III rectal cancer which was diagnosed in 2009. He underwent chemotherapy and radiation followed by low anterior resection with diverting ileostomy. He had the ileostomy reversed in 2010 per Dr. Johney Maine. He had undergone workup in 2013 for iron deficiency anemia and had negative endoscopy and colonoscopy He was noted at colonoscopy to have a polypoid mass at his anastomosis 7 cm from the anus and mild diverticulosis. The polypoid lesion was biopsied and returned showing just granulation tissue. This was followed by flexible sigmoidoscopy with finding of a 2 cm area of friable bleeding mucosa which was treated with APC. He has not been seen in our office since. He says he has been doing well released from oncology follow-up by Dr. Benay Spice last year. Patient says his current symptoms started out a week ago and became pretty severe last Friday taking him to the emergency room. He was felt to have an anal fissure. 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 He was given a prescription for pain medication and nitroglycerin ointment which he has not been able to have filled as his pharmacy did not carry it and he states he was going to be $600. He continues to have discomfort which he describes as a continuous burning-type of sensation which is worse with bowel movements. He has been noticing some bright  red blood with bowel movements as well. He says his bowel movements are normal for him about 3-4 per day had any hard stools. He says ever since his surgery he has had some difficulty with evacuation which requires increased straining.  Review of Systems Pertinent positive and negative review of systems were noted in the above HPI section.  All other review of systems was otherwise negative.  Outpatient Encounter Prescriptions as of 05/10/2015  Medication Sig  . docusate sodium (COLACE) 100 MG capsule Take 1 capsule (100 mg total) by mouth every 12 (twelve) hours.  . gabapentin (NEURONTIN) 300 MG capsule Take 1 capsule (300 mg total) by mouth 3 (three) times daily. (Patient taking differently: Take 300-600 mg by mouth See admin instructions. Take 600 mg every morning and 300 mg every night.)  . levothyroxine (SYNTHROID) 175 MCG tablet Take 1 tablet (175 mcg total) by mouth daily before breakfast.  . metFORMIN (GLUCOPHAGE-XR) 500 MG 24 hr tablet 4 tabs by mouth each am  . Nitroglycerin 0.4 % OINT Apply per recturm twice a day for 8 weeks  . oxyCODONE-acetaminophen (PERCOCET/ROXICET) 5-325 MG tablet Take 2 tablets by mouth every 4 (four) hours as needed for severe pain.  Marland Kitchen diltiazem 2 % GEL Apply small amount of gel to the rectum up to the first knuckle 4 times daily.  . Na Sulfate-K Sulfate-Mg Sulf SOLN Take 1 kit by mouth once.   No facility-administered encounter medications on file as of 05/10/2015.   No Known Allergies  Patient Active Problem List   Diagnosis Date Noted  . Neuropathy of both upper extremities ([place]) 01/24/2014  . Bilateral carpal tunnel syndrome 09/27/2013  . Hyperlipidemia with target LDL less than 100 09/23/2012  . Hypogonadism, male 11/13/2011  . B12 deficiency anemia 06/17/2011  . Iron deficiency anemia 10/29/2010  . Hypothyroidism 08/28/2010  . Type II diabetes mellitus with manifestations ([place]) 08/27/2010  . ED (erectile dysfunction) 08/27/2010  . Malignant neoplasm  of sigmoid colon ([place]) 07/16/2007  . Morbid obesity ([place]) 06/15/2007   Social History   Social History  . Marital Status: Married    Spouse Name: N/A  . Number of Children: 2  . Years of Education: N/A   Occupational History  . ARTIST    Social History Main Topics  . Smoking status: Never Smoker   . Smokeless tobacco: Never Used  . Alcohol Use: 0.0 oz/week    0 Standard drinks or equivalent per week     Comment: social  . Drug Use: No  . Sexual Activity: Yes   Other Topics Concern  . Not on file   Social History Narrative   Lives with wife in a one story home with a basement.  Has 2 children.     Works for a [employer] in the [employer].     Education: some college classes.     Mr. Pohle family history includes Arthritis in his mother; Diabetes in his mother; Healthy in his daughter and son; Heart attack in his father; Heart disease in his brother; Hyperlipidemia in his father; Hypertension in his father; Stomach cancer in his paternal grandmother; Stroke in his father. There is no history of Colon cancer, Esophageal cancer, or Rectal cancer.      Objective:    Filed Vitals:   05/10/15 1401  BP: 102/80  Pulse: 70    Physical Exam  well-developed white male in no acute distress, pleasant blood pressure 102/80 pulse 70 height 6 foot 1 weight 280 HEENT; nontraumatic, cephalic EOMI PERRLA sclera anicteric, Cardiovascular ;regular rate and rhythm with S1-S2 no murmur or gallop, Pulmonary ;clear bilaterally, Abdomen; large soft nontender nondistended bowel sounds are active there is no palpable mass or hepatosplenomegaly, midline incisional scar and ileostomy scar healed  ; Rectal ;exam, no external lesions noted on digital exam he is tender and may have a shallow anterior fissure, no lesion felt     Assessment & Plan:   #1 50 yo WM with hx of rectal Cancer dx 2009 -Status post chemoradiation followed by low anterior resection with diverting ileostomy which  was later reversed. Last colonoscopy March 2013 with finding of polypoid mass/tissue at the anastomosis. This was biopsied and shown to be granulation tissue which was then treated with APC per Dr. Deatra Ina  #2   7-10 day history of anal rectal burning pain and bright red blood with bowel movements He may have a shallow anterior fissure, but with his history need to rule out recurrence of rectal neoplasm and/or recurrence of friable granulation tissue at the anastomosis #3 obesity #4 neuropathy #5 adult-onset diabetes mellitus  Plan; scheduled for colonoscopy with Dr. Carlean Purl. Patient had requested Dr. Carlean Purl. Procedure was discussed with the patient in detail and he is agreeable to proceed Continue Colace 2  by mouth daily Will give him a trial of recticare/4% lidocaine gel apply 3-4 times daily and diltiazem gel 2% apply 3-4 times daily over the next few weeks until symptoms improve   Amy S Esterwood PA-C 05/10/2015  Cc: Janith Lima, MD

## 2015-05-10 NOTE — Patient Instructions (Addendum)
Continue the colace. You can get Recticare cream.  over the counter which has 5% lidocaine.   We sent the colonoscopy prep kit presccription to [place], [place].  We called in a prescription to [address] for the Diltiazem Gel 2 %. Apply 4 times daily to the rectum.  Get appointment with orthopedic surgeon about your hip.  You have been scheduled for a colonoscopy. Please follow written instructions given to you at your visit today.  Please pick up your prep supplies at the pharmacy within the next 1-3 days. If you use inhalers (even only as needed), please bring them with you on the day of your procedure. Your physician has requested that you go to www.startemmi.com and enter the access code given to you at your visit today. This web site gives a general overview about your procedure. However, you should still follow specific instructions given to you by our office regarding your preparation for the procedure.

## 2015-05-11 NOTE — Telephone Encounter (Signed)
Patient arrived for the appointment.

## 2015-05-15 NOTE — Progress Notes (Signed)
Agree with Ms. Esterwood's assessment and plan. Carl E. Gessner, MD, FACG   

## 2015-05-17 ENCOUNTER — Telehealth: Payer: Self-pay | Admitting: Physician Assistant

## 2015-05-17 MED ORDER — TRAMADOL HCL 50 MG PO TABS: 50.0000 mg | ORAL_TABLET | Freq: Four times a day (QID) | ORAL | Status: AC | PRN

## 2015-05-17 NOTE — Telephone Encounter (Signed)
Patient is calling regarding this call from this morning.  He states he is in [place] and it takes a while to get here.  Call # [phone]

## 2015-05-17 NOTE — Telephone Encounter (Signed)
Patient reports terrible rectal pain. He is requesting something for pain.  He was given oxycodone in the past.  I explained that if that was to be prescribed that he would need to come from [place] and pick up the RX that it can't be sent in.  He asks for an alternative.  Nicoletta Ba PA please advise

## 2015-05-17 NOTE — Telephone Encounter (Signed)
Can fax rx for tramadol 50 mg - one po q 6 hours  Prn for pain. # 40 /0.Marland KitchenMarland KitchenWhen is his colonoscopy .Marland Kitchen May need to move up ?Marland Kitchen. Also is he using the recticare and diltiazem I gave him at visit ?

## 2015-05-17 NOTE — Telephone Encounter (Signed)
Patient notified rx faxed to [place] He will keep colonoscopy as scheduled for Monday 05/22/15.   He is using diltiazem and recticare

## 2015-05-22 ENCOUNTER — Encounter: Payer: Self-pay | Admitting: Internal Medicine

## 2015-05-22 ENCOUNTER — Ambulatory Visit (AMBULATORY_SURGERY_CENTER): Payer: BLUE CROSS/BLUE SHIELD | Admitting: Internal Medicine

## 2015-05-22 VITALS — BP 122/77 | HR 49 | Temp 98.4°F | Resp 9 | Ht 73.0 in | Wt 280.0 lb

## 2015-05-22 DIAGNOSIS — K635 Polyp of colon: Secondary | ICD-10-CM

## 2015-05-22 DIAGNOSIS — K626 Ulcer of anus and rectum: Secondary | ICD-10-CM | POA: Diagnosis not present

## 2015-05-22 DIAGNOSIS — D122 Benign neoplasm of ascending colon: Secondary | ICD-10-CM

## 2015-05-22 DIAGNOSIS — K625 Hemorrhage of anus and rectum: Secondary | ICD-10-CM

## 2015-05-22 LAB — GLUCOSE, CAPILLARY
GLUCOSE-CAPILLARY: 160 mg/dL — AB (ref 65–99)
Glucose-Capillary: 154 mg/dL — ABNORMAL HIGH (ref 65–99)

## 2015-05-22 MED ORDER — SODIUM CHLORIDE 0.9 % IV SOLN: 500.0000 mL | INTRAVENOUS | Status: DC

## 2015-05-22 NOTE — Progress Notes (Signed)
Called to room to assist during endoscopic procedure.  Patient ID and intended procedure confirmed with present staff. Received instructions for my participation in the procedure from the performing physician.  

## 2015-05-22 NOTE — Patient Instructions (Addendum)
Nothing bad going on - there are some ulcers in the rectum - likely a result of prior surgery and radiation. I took biopsies to check them out. Also removed one tiny polyp.  Continue current treatments.  I will let you know pathology results and when to have another routine colonoscopy by mail.  I appreciate the opportunity to care for you. Gatha Mayer, MD, FACG  YOU HAD AN ENDOSCOPIC PROCEDURE TODAY AT [facility]:   Refer to the procedure report that was given to you for any specific questions about what was found during the examination.  If the procedure report does not answer your questions, please call your gastroenterologist to clarify.  If you requested that your care partner not be given the details of your procedure findings, then the procedure report has been included in a sealed envelope for you to review at your convenience later.  YOU SHOULD EXPECT: Some feelings of bloating in the abdomen. Passage of more gas than usual.  Walking can help get rid of the air that was put into your GI tract during the procedure and reduce the bloating. If you had a lower endoscopy (such as a colonoscopy or flexible sigmoidoscopy) you may notice spotting of blood in your stool or on the toilet paper. If you underwent a bowel prep for your procedure, you may not have a normal bowel movement for a few days.  Please Note:  You might notice some irritation and congestion in your nose or some drainage.  This is from the oxygen used during your procedure.  There is no need for concern and it should clear up in a day or so.  SYMPTOMS TO REPORT IMMEDIATELY:   Following lower endoscopy (colonoscopy or flexible sigmoidoscopy):  Excessive amounts of blood in the stool  Significant tenderness or worsening of abdominal pains  Swelling of the abdomen that is new, acute  Fever of 100F or higher   For urgent or emergent issues, a gastroenterologist can be reached at any hour by  calling [phone].   DIET: Your first meal following the procedure should be a small meal and then it is ok to progress to your normal diet. Heavy or fried foods are harder to digest and may make you feel nauseous or bloated.  Likewise, meals heavy in dairy and vegetables can increase bloating.  Drink plenty of fluids but you should avoid alcoholic beverages for 24 hours.  ACTIVITY:  You should plan to take it easy for the rest of today and you should NOT DRIVE or use heavy machinery until tomorrow (because of the sedation medicines used during the test).    FOLLOW UP: Our staff will call the number listed on your records the next business day following your procedure to check on you and address any questions or concerns that you may have regarding the information given to you following your procedure. If we do not reach you, we will leave a message.  However, if you are feeling well and you are not experiencing any problems, there is no need to return our call.  We will assume that you have returned to your regular daily activities without incident.  If any biopsies were taken you will be contacted by phone or by letter within the next 1-3 weeks.  Please call us at [phone] if you have not heard about the biopsies in 3 weeks.    SIGNATURES/CONFIDENTIALITY: You and/or your care partner have signed paperwork which will be  entered into your electronic medical record.  These signatures attest to the fact that that the information above on your After Visit Summary has been reviewed and is understood.  Full responsibility of the confidentiality of this discharge information lies with you and/or your care-partner.  Thank-you for choosing [place] for your healthcare needs today.

## 2015-05-22 NOTE — Op Note (Signed)
[facility] Patient Name: Adam Wilson Procedure Date: 05/22/2015 11:18 AM MRN: [id number] Endoscopist: Gatha Mayer , MD Age: 50 Date of Birth: Dec 22, 1965 Gender: Male Procedure:                Colonoscopy Indications:              Evaluation of unexplained GI bleeding Medicines:                Propofol per Anesthesia, Monitored Anesthesia Care Procedure:                Pre-Anesthesia Assessment:                           - Prior to the procedure, a History and Physical                            was performed, and patient medications and                            allergies were reviewed. The patient's tolerance of                            previous anesthesia was also reviewed. The risks                            and benefits of the procedure and the sedation                            options and risks were discussed with the patient.                            All questions were answered, and informed consent                            was obtained. Prior Anticoagulants: The patient has                            taken no previous anticoagulant or antiplatelet                            agents. ASA Grade Assessment: III - A patient with                            severe systemic disease. After reviewing the risks                            and benefits, the patient was deemed in                            satisfactory condition to undergo the procedure.                           After obtaining informed consent, the colonoscope  was passed under direct vision. Throughout the                            procedure, the patient's blood pressure, pulse, and                            oxygen saturations were monitored continuously. The                            Model CF-HQ190L [id number]) scope was introduced                            through the anus and advanced to the the cecum,                            identified by appendiceal orifice  and ileocecal                            valve. The colonoscopy was performed without                            difficulty. The patient tolerated the procedure                            well. The quality of the bowel preparation was                            adequate. The bowel preparation used was [employer].                            The appendiceal orifice and the rectum were                            photographed. Scope In: 11:30:13 AM Scope Out: 11:40:11 AM Scope Withdrawal Time: 0 hours 8 minutes 30 seconds  Total Procedure Duration: 0 hours 9 minutes 58 seconds  Findings:                 The digital rectal exam findings include changes                            s/p surgery and radiation for rectal cancer with                            indurated narrow vault.                           A 2 mm polyp was found in the ascending colon. The                            polyp was sessile. The polyp was removed with a                            cold biopsy forceps. Resection and retrieval were  complete. Verification of patient identification                            for the specimen was done. Estimated blood loss was                            minimal.                           A few ulcers were found in the rectum. Superficial                            and friable in area of surgery. Stigmata of recent                            bleeding were present. Biopsies were taken with a                            cold forceps for histology. Verification of patient                            identification for the specimen was done. Estimated                            blood loss was minimal.                           Retroflexion in the rectum was not performed due to                            post-surgical anatomy.                           A few diverticula were found in the sigmoid colon.                            There was no evidence of diverticular  bleeding.                           The exam was otherwise without abnormality. Complications:            No immediate complications. Estimated Blood Loss:     Estimated blood loss was minimal. Impression:               - Changes s/p surgery and radiation for rectal                            cancer with indurated narrow vault found on digital                            rectal exam.                           - One 2 mm polyp in the ascending colon, removed  with a cold biopsy forceps. Resected and retrieved.                           - A few ulcers in the rectum. Biopsied.                           - Mild diverticulosis in the sigmoid colon. There                            was no evidence of diverticular bleeding.                           - The examination was otherwise normal. Recommendation:           - Patient has a contact number available for                            emergencies. The signs and symptoms of potential                            delayed complications were discussed with the                            patient. Return to normal activities tomorrow.                            Written discharge instructions were provided to the                            patient.                           - Resume previous diet.                           - Continue present medications. Diltiazem gel,                            lidocaine                           - Repeat colonoscopy is recommended for                            surveillance. The colonoscopy date will be                            determined after pathology results from today's                            exam become available for review. it will be after                            the current recall date Gatha Mayer, MD 05/22/2015 11:50:24 AM This report has been signed electronically. CC Letter to:  Janith Lima, MD

## 2015-05-22 NOTE — Progress Notes (Signed)
Report to PACU, RN, vss, BBS= Clear.  

## 2015-05-23 ENCOUNTER — Telehealth: Payer: Self-pay

## 2015-05-23 LAB — HM COLONOSCOPY: HM COLON: ABNORMAL — AB

## 2015-05-23 NOTE — Telephone Encounter (Signed)
  Follow up Call-  Call back number 05/22/2015  Post procedure Call Back phone  # [phone]  Permission to leave phone message Yes     Patient questions:  Do you have a fever, pain , or abdominal swelling? No. Pain Score  0 *  Have you tolerated food without any problems? Yes.    Have you been able to return to your normal activities? Yes.    Do you have any questions about your discharge instructions: Diet   No. Medications  No. Follow up visit  No.  Do you have questions or concerns about your Care? No.  Actions: * If pain score is 4 or above: No action needed, pain <4.   No problems per the pt. maw

## 2015-05-24 ENCOUNTER — Telehealth: Payer: Self-pay | Admitting: Internal Medicine

## 2015-05-24 NOTE — Telephone Encounter (Signed)
Yes and for date of colonoscopy

## 2015-05-24 NOTE — Telephone Encounter (Signed)
Dr. Carlean Purl ok to give work note for 3/27-3/31?

## 2015-05-24 NOTE — Telephone Encounter (Signed)
Note faxed I left a message for the patient that I sent note and call back if he has questions or concerns

## 2015-05-30 NOTE — Progress Notes (Signed)
Quick Note:  No cancer in biopsies and polyp was not a precancerous polyp Continue current treatment and see me in June or July - sooner prn - let me know now if he does not think he is getting better w/ diltiazem and recticare  [facility] no letter Recall colon 2022 ______

## 2015-07-03 ENCOUNTER — Other Ambulatory Visit: Payer: Self-pay | Admitting: Internal Medicine

## 2015-08-10 ENCOUNTER — Telehealth: Payer: Self-pay | Admitting: Oncology

## 2015-08-10 NOTE — Telephone Encounter (Signed)
S/w pt, advised appt 7/3 chgd to 7/25 due to md pal. Pt says he needs the appt on Monday because it is the only day his wife can come with him. Moved appt to 8/7 @ 3pm per pt's req.

## 2015-08-14 ENCOUNTER — Ambulatory Visit: Payer: BLUE CROSS/BLUE SHIELD | Admitting: Oncology

## 2015-08-14 ENCOUNTER — Other Ambulatory Visit: Payer: BLUE CROSS/BLUE SHIELD

## 2015-09-05 ENCOUNTER — Other Ambulatory Visit: Payer: Self-pay | Admitting: Neurology

## 2015-09-05 ENCOUNTER — Telehealth: Payer: Self-pay | Admitting: Oncology

## 2015-09-05 ENCOUNTER — Other Ambulatory Visit: Payer: BLUE CROSS/BLUE SHIELD

## 2015-09-05 ENCOUNTER — Ambulatory Visit: Payer: BLUE CROSS/BLUE SHIELD | Admitting: Oncology

## 2015-09-05 ENCOUNTER — Other Ambulatory Visit: Payer: Self-pay | Admitting: Internal Medicine

## 2015-09-05 DIAGNOSIS — E038 Other specified hypothyroidism: Secondary | ICD-10-CM

## 2015-09-05 NOTE — Telephone Encounter (Signed)
Appointment confirmed with patient. Adam Wilson °

## 2015-09-05 NOTE — Telephone Encounter (Signed)
Rx refused.  Patient needs an appointment.

## 2015-09-18 ENCOUNTER — Other Ambulatory Visit: Payer: BLUE CROSS/BLUE SHIELD

## 2015-09-18 ENCOUNTER — Ambulatory Visit: Payer: BLUE CROSS/BLUE SHIELD | Admitting: Oncology

## 2015-09-30 ENCOUNTER — Telehealth: Payer: Self-pay | Admitting: Oncology

## 2015-09-30 NOTE — Telephone Encounter (Signed)
Lvm advising appt chgd from 8/28 due to md pal to 9/11 @ 12p.

## 2015-10-02 ENCOUNTER — Other Ambulatory Visit: Payer: Self-pay | Admitting: Internal Medicine

## 2015-10-09 ENCOUNTER — Other Ambulatory Visit: Payer: BLUE CROSS/BLUE SHIELD

## 2015-10-09 ENCOUNTER — Ambulatory Visit: Payer: BLUE CROSS/BLUE SHIELD | Admitting: Oncology

## 2015-10-20 ENCOUNTER — Other Ambulatory Visit: Payer: Self-pay | Admitting: *Deleted

## 2015-10-20 DIAGNOSIS — Z85048 Personal history of other malignant neoplasm of rectum, rectosigmoid junction, and anus: Secondary | ICD-10-CM

## 2015-10-23 ENCOUNTER — Ambulatory Visit: Payer: BLUE CROSS/BLUE SHIELD | Admitting: Oncology

## 2015-10-23 ENCOUNTER — Other Ambulatory Visit: Payer: BLUE CROSS/BLUE SHIELD

## 2016-09-25 ENCOUNTER — Other Ambulatory Visit: Payer: Self-pay | Admitting: Internal Medicine

## 2016-09-25 DIAGNOSIS — E038 Other specified hypothyroidism: Secondary | ICD-10-CM

## 2019-02-12 DIAGNOSIS — I219 Acute myocardial infarction, unspecified: Secondary | ICD-10-CM

## 2019-02-12 HISTORY — PX: HIP SURGERY: SHX245

## 2019-02-12 HISTORY — DX: Acute myocardial infarction, unspecified: I21.9

## 2020-02-12 DIAGNOSIS — I639 Cerebral infarction, unspecified: Secondary | ICD-10-CM

## 2020-02-12 HISTORY — DX: Cerebral infarction, unspecified: I63.9

## 2020-10-16 ENCOUNTER — Encounter: Payer: Self-pay | Admitting: Internal Medicine

## 2021-05-09 ENCOUNTER — Encounter: Payer: Self-pay | Admitting: Internal Medicine

## 2021-06-18 ENCOUNTER — Ambulatory Visit (AMBULATORY_SURGERY_CENTER): Payer: Self-pay | Admitting: *Deleted

## 2021-06-18 VITALS — Ht 73.0 in | Wt 247.2 lb

## 2021-06-18 DIAGNOSIS — Z85048 Personal history of other malignant neoplasm of rectum, rectosigmoid junction, and anus: Secondary | ICD-10-CM

## 2021-06-18 NOTE — Progress Notes (Signed)
No egg or soy allergy known to patient  ?No issues known to pt with past sedation with any surgeries or procedures ?Patient denies ever being told they had issues or difficulty with intubation  ?No FH of Malignant Hyperthermia ?Pt is not on diet pills ?Pt is not on  home 02  ?Pt is not on blood thinners  ?Pt denies issues with constipation  ?No A fib or A flutter ? ? ?PV completed in person. Pt verified name, DOB, address and insurance during PV today.  ? ?Pt encouraged to call with questions or issues.  ? ?

## 2021-07-09 ENCOUNTER — Encounter: Payer: Self-pay | Admitting: Certified Registered Nurse Anesthetist

## 2021-07-13 ENCOUNTER — Encounter: Payer: Self-pay | Admitting: Internal Medicine

## 2021-07-16 ENCOUNTER — Ambulatory Visit (AMBULATORY_SURGERY_CENTER): Payer: BC Managed Care – PPO | Admitting: Internal Medicine

## 2021-07-16 ENCOUNTER — Encounter: Payer: Self-pay | Admitting: Internal Medicine

## 2021-07-16 VITALS — BP 117/78 | HR 62 | Temp 97.7°F | Resp 13 | Ht 73.0 in | Wt 247.2 lb

## 2021-07-16 DIAGNOSIS — Z85048 Personal history of other malignant neoplasm of rectum, rectosigmoid junction, and anus: Secondary | ICD-10-CM | POA: Diagnosis present

## 2021-07-16 MED ORDER — SODIUM CHLORIDE 0.9 % IV SOLN: 500.0000 mL | Freq: Once | INTRAVENOUS | Status: DC

## 2021-07-16 NOTE — Progress Notes (Signed)
Pt's states no medical or surgical changes since previsit or office visit. 

## 2021-07-16 NOTE — Progress Notes (Signed)
Report given to PACU, vss 

## 2021-07-16 NOTE — Progress Notes (Signed)
[facility] History and Physical   Primary Care Physician:  Sherrilee Gilles, DO   Reason for Procedure:   Hx rectal cancer  Plan:    colonoscopy     HPI: Adam Wilson is a 56 y.o. 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   Rectal carcinoma ([place]) 02/12/2007   Stroke ([place]) 2022    Past Surgical History:  Procedure Laterality Date   COLON SURGERY  02/12/2008   FLEXIBLE SIGMOIDOSCOPY  06/05/2011   Procedure: FLEXIBLE SIGMOIDOSCOPY;  Surgeon: Inda Castle, MD;  Location: [facility];  Service: Endoscopy;  Laterality: N/A;   HERNIA REPAIR     HIP SURGERY Left 2021   HOT HEMOSTASIS  06/05/2011   Procedure: HOT HEMOSTASIS (ARGON PLASMA COAGULATION/BICAP);  Surgeon: Inda Castle, MD;  Location: [facility];  Service: Endoscopy;  Laterality: N/A;   ILEOSTOMY     ILEOSTOMY CLOSURE     PORTACATH PLACEMENT     TONSILLECTOMY     UMBILICAL HERNIA REPAIR      Prior to Admission medications   Medication Sig Start Date End Date Taking? Authorizing Provider  aspirin 81 MG EC tablet Take by mouth. 07/16/19  Yes [provider]  atorvastatin (LIPITOR) 40 MG tablet atorvastatin 40 mg tablet  Take 1 tablet every day by oral route for 90 days. 01/05/19  Yes [provider]  celecoxib (CELEBREX) 200 MG capsule Take 200 mg by mouth daily as needed. 04/16/21  Yes [provider]  Cholecalciferol 125 MCG (5000 UT) TABS Vitamin D3 125 mcg (5,000 unit) tablet  Take 1 tablet every day by oral route as directed for 90 days.   Yes [provider]  empagliflozin (JARDIANCE) 25 MG TABS tablet Take by mouth.  07/11/20  Yes [provider]  glipiZIDE (GLUCOTROL XL) 10 MG 24 hr tablet Take by mouth. 05/22/19  Yes [provider]  levothyroxine (SYNTHROID) 175 MCG tablet Take 1 tablet (175 mcg total) by mouth daily before breakfast. 09/01/14  Yes Janith Lima, MD  Methylcobalamin (B-12) 1000 MCG TBDP Take by mouth.   Yes [provider]  diltiazem 2 % GEL Apply small amount of gel to the rectum up to the first knuckle 4 times daily. Patient not taking: Reported on 06/18/2021 05/10/15   Esterwood, Amy S, PA-C  docusate sodium (COLACE) 100 MG capsule Take 1 capsule (100 mg total) by mouth every 12 (twelve) hours. Patient not taking: Reported on 06/18/2021 05/08/15   Lacretia Leigh, MD  gabapentin (NEURONTIN) 300 MG capsule Take 1 capsule (300 mg total) by mouth 3 (three) times daily. Patient not taking: Reported on 06/18/2021 08/29/14   Narda Amber K, DO  ibuprofen (ADVIL) 800 MG tablet Take 1 tablet by mouth every 8 (eight) hours. Patient not taking: Reported on 06/18/2021 01/05/19   [provider]  metFORMIN (GLUCOPHAGE-XR) 500 MG 24 hr tablet Take 4 tablets (2,000 mg total) by mouth daily with breakfast. Yearly physical is due must see md for refills Patient not taking: Reported on 06/18/2021 07/03/15   Janith Lima, MD  Nitroglycerin 0.4 % OINT Apply  per recturm twice a day for 8 weeks Patient not taking: Reported on 06/18/2021 05/08/15   Lacretia Leigh, MD  oxyCODONE-acetaminophen (PERCOCET/ROXICET) 5-325 MG tablet Take 2 tablets by mouth every 4 (four) hours as needed for severe pain. Patient not taking: Reported on 06/18/2021 05/08/15   Lacretia Leigh, MD  traMADol (ULTRAM) 50 MG tablet Take 1 tablet (50 mg total) by mouth every 6 (six) hours as needed. Patient not taking: Reported on 06/18/2021 05/17/15   Alfredia Ferguson, PA-C    Current Outpatient Medications  Medication Sig Dispense Refill   aspirin 81 MG EC tablet Take by mouth.     atorvastatin (LIPITOR) 40 MG tablet  atorvastatin 40 mg tablet  Take 1 tablet every day by oral route for 90 days.     celecoxib (CELEBREX) 200 MG capsule Take 200 mg by mouth daily as needed.     Cholecalciferol 125 MCG (5000 UT) TABS Vitamin D3 125 mcg (5,000 unit) tablet  Take 1 tablet every day by oral route as directed for 90 days.     empagliflozin (JARDIANCE) 25 MG TABS tablet Take by mouth.     glipiZIDE (GLUCOTROL XL) 10 MG 24 hr tablet Take by mouth.     levothyroxine (SYNTHROID) 175 MCG tablet Take 1 tablet (175 mcg total) by mouth daily before breakfast. 90 tablet 3   Methylcobalamin (B-12) 1000 MCG TBDP Take by mouth.     diltiazem 2 % GEL Apply small amount of gel to the rectum up to the first knuckle 4 times daily. (Patient not taking: Reported on 06/18/2021) 30 g 1   docusate sodium (COLACE) 100 MG capsule Take 1 capsule (100 mg total) by mouth every 12 (twelve) hours. (Patient not taking: Reported on 06/18/2021) 60 capsule 0   gabapentin (NEURONTIN) 300 MG capsule Take 1 capsule (300 mg total) by mouth 3 (three) times daily. (Patient not taking: Reported on 06/18/2021) 270 capsule 3   ibuprofen (ADVIL) 800 MG tablet Take 1 tablet by mouth every 8 (eight) hours. (Patient not taking: Reported on 06/18/2021)     metFORMIN (GLUCOPHAGE-XR) 500 MG 24 hr tablet Take 4 tablets (2,000 mg total) by mouth daily with breakfast. Yearly physical is due must see md for refills (Patient not taking: Reported on 06/18/2021) 360 tablet 0   Nitroglycerin 0.4 % OINT Apply per recturm twice a day for 8 weeks (Patient not taking: Reported on 06/18/2021) 1 Tube 0   oxyCODONE-acetaminophen (PERCOCET/ROXICET) 5-325 MG tablet Take 2 tablets by mouth every 4 (four) hours as needed for severe pain. (Patient not taking: Reported on 06/18/2021) 15 tablet 0   traMADol (ULTRAM) 50 MG tablet Take 1 tablet (50 mg total) by mouth every 6 (six) hours as needed. (Patient not taking: Reported on 06/18/2021) 40 tablet 0   Current Facility-Administered Medications   Medication Dose Route Frequency Provider Last Rate Last Admin   0.9 %  sodium chloride infusion  500 mL Intravenous Once Gatha Mayer, MD        Allergies as of 07/16/2021   (No Known Allergies)    Family History  Problem Relation Age of Onset   Arthritis Mother    Diabetes Mother    Hyperlipidemia Father    Stroke Father    Hypertension Father    Heart attack Father    Stomach cancer Paternal Grandmother    Colon cancer Neg Hx    Esophageal cancer Neg Hx    Rectal cancer Neg Hx    Heart disease  Brother    Healthy Son    Healthy Daughter     Social History   Socioeconomic History   Marital status: Married    Spouse name: Not on file   Number of children: 2   Years of education: Not on file   Highest education level: Not on file  Occupational History   Occupation: ARTIST    Employer: SELF EMPLOYED  Tobacco Use   Smoking status: Never   Smokeless tobacco: Never  Vaping Use   Vaping Use: Never used  Substance and Sexual Activity   Alcohol use: Yes    Alcohol/week: 0.0 standard drinks    Comment: social   Drug use: No   Sexual activity: Yes  Other Topics Concern   Not on file  Social History Narrative   Lives with wife in a one story home with a basement.  Has 2 children.     Works for a [employer] in the [employer].     Education: some college classes.    Social Determinants of Health   Financial Resource Strain: Not on file  Food Insecurity: Not on file  Transportation Needs: Not on file  Physical Activity: Not on file  Stress: Not on file  Social Connections: Not on file  Intimate Partner Violence: Not on file    Review of Systems:  All other review of systems negative except as mentioned in the HPI.  Physical Exam: Vital signs BP (!) 157/95   Pulse 60   Temp 97.7 F (36.5 C)   Resp 13   Ht '6\' 1"'$  (1.854 m)   Wt 247 lb 3.2 oz (112.1 kg)   SpO2 100%   BMI 32.61 kg/m   General:   Alert,  Well-developed, well-nourished,  pleasant and cooperative in NAD Lungs:  Clear throughout to auscultation.   Heart:  Regular rate and rhythm; no murmurs, clicks, rubs,  or gallops. Abdomen:  Soft, nontender and nondistended. Normal bowel sounds.  Surgical scars Neuro/Psych:  Alert and cooperative. Normal mood and affect. A and O x 3   '@[place]'$  Simonne Maffucci, MD, [facility] [phone] (pager) 07/16/2021 10:04 AM@

## 2021-07-16 NOTE — Patient Instructions (Addendum)
No polyps or cancer seen. Some inflammation at surgical anastomosis as before. Your next routine colonoscopy should be in 5 years - 2028.  YOU HAD AN ENDOSCOPIC PROCEDURE TODAY AT [facility]:   Refer to the procedure report that was given to you for any specific questions about what was found during the examination.  If the procedure report does not answer your questions, please call your gastroenterologist to clarify.  If you requested that your care partner not be given the details of your procedure findings, then the procedure report has been included in a sealed envelope for you to review at your convenience later.  YOU SHOULD EXPECT: Some feelings of bloating in the abdomen. Passage of more gas than usual.  Walking can help get rid of the air that was put into your GI tract during the procedure and reduce the bloating. If you had a lower endoscopy (such as a colonoscopy or flexible sigmoidoscopy) you may notice spotting of blood in your stool or on the toilet paper. If you underwent a bowel prep for your procedure, you may not have a normal bowel movement for a few days.  Please Note:  You might notice some irritation and congestion in your nose or some drainage.  This is from the oxygen used during your procedure.  There is no need for concern and it should clear up in a day or so.  SYMPTOMS TO REPORT IMMEDIATELY:  Following lower endoscopy (colonoscopy or flexible sigmoidoscopy):  Excessive amounts of blood in the stool  Significant tenderness or worsening of abdominal pains  Swelling of the abdomen that is new, acute  Fever of 100F or higher  For urgent or emergent issues, a gastroenterologist can be reached at any hour by calling [phone]. Do not use MyChart messaging for urgent concerns.    DIET:  We do recommend a small meal at first, but then you may proceed to your regular diet.  Drink plenty of fluids but you should avoid alcoholic beverages for 24  hours.  ACTIVITY:  You should plan to take it easy for the rest of today and you should NOT DRIVE or use heavy machinery until tomorrow (because of the sedation medicines used during the test).    FOLLOW UP: Our staff will call the number listed on your records 24-72 hours following your procedure to check on you and address any questions or concerns that you may have regarding the information given to you following your procedure. If we do not reach you, we will leave a message.  We will attempt to reach you two times.  During this call, we will ask if you have developed any symptoms of COVID 19. If you develop any symptoms (ie: fever, flu-like symptoms, shortness of breath, cough etc.) before then, please call [phone].  If you test positive for Covid 19 in the 2 weeks post procedure, please call and report this information to [place].    If any biopsies were taken you will be contacted by phone or by letter within the next 1-3 weeks.  Please call us at [phone] if you have not heard about the biopsies in 3 weeks.    SIGNATURES/CONFIDENTIALITY: You and/or your care partner have signed paperwork which will be entered into your electronic medical record.  These signatures attest to the fact that that the information above on your After Visit Summary has been reviewed and is understood.  Full responsibility of the confidentiality of this discharge information lies with  you and/or your care-partner.

## 2021-07-16 NOTE — Op Note (Signed)
[facility] Patient Name: Adam Wilson Procedure Date: 07/16/2021 9:57 AM MRN: [id number] Endoscopist: Gatha Mayer , MD Age: 56 Referring MD:  Date of Birth: 1965/12/16 Gender: Male Account #: [id number] Procedure:                Colonoscopy Indications:              High risk colon cancer surveillance: Personal                            history of rectal cancer Medicines:                Monitored Anesthesia Care Procedure:                Pre-Anesthesia Assessment:                           - Prior to the procedure, a History and Physical                            was performed, and patient medications and                            allergies were reviewed. The patient's tolerance of                            previous anesthesia was also reviewed. The risks                            and benefits of the procedure and the sedation                            options and risks were discussed with the patient.                            All questions were answered, and informed consent                            was obtained. Prior Anticoagulants: The patient has                            taken no previous anticoagulant or antiplatelet                            agents. ASA Grade Assessment: III - A patient with                            severe systemic disease. After reviewing the risks                            and benefits, the patient was deemed in                            satisfactory condition to undergo the procedure.  After obtaining informed consent, the colonoscope                            was passed under direct vision. Throughout the                            procedure, the patient's blood pressure, pulse, and                            oxygen saturations were monitored continuously. The                            Olympus CF-HQ190L [id number]) Colonoscope was                            introduced through the anus and advanced to  the the                            cecum, identified by appendiceal orifice and                            ileocecal valve. The colonoscopy was performed                            without difficulty. The patient tolerated the                            procedure well. The quality of the bowel                            preparation was good. The ileocecal valve,                            appendiceal orifice, and rectum were photographed.                            The bowel preparation used was Miralax via split                            dose instruction. Scope In: 10:14:10 AM Scope Out: 10:23:46 AM Scope Withdrawal Time: 0 hours 7 minutes 51 seconds  Total Procedure Duration: 0 hours 9 minutes 36 seconds  Findings:                 The digital rectal exam findings include narrowed                            vault post XRT and surgery. Pertinent negatives                            include no palpable rectal lesions.                           There was evidence of a prior end-to-end  colo-colonic anastomosis in the rectum. This was                            patent and was characterized by inflammation. The                            anastomosis was traversed.                           A few diverticula were found in the left colon.                           The exam was otherwise without abnormality. Complications:            No immediate complications. Estimated Blood Loss:     Estimated blood loss: none. Impression:               - Narrowed vault post XRT and surgery found on                            digital rectal exam.                           - Patent end-to-end colo-colonic anastomosis,                            characterized by inflammation. The superficial                            ulceration is same as before and has been biopsied                            in past (benign).                           - Diverticulosis in the left colon.                            - The examination was otherwise normal.                           - No specimens collected. Recommendation:           - Patient has a contact number available for                            emergencies. The signs and symptoms of potential                            delayed complications were discussed with the                            patient. Return to normal activities tomorrow.                            Written discharge instructions were provided to the  patient.                           - Resume previous diet.                           - Continue present medications.                           - Repeat colonoscopy in 5 years for surveillance. Gatha Mayer, MD 07/16/2021 10:32:32 AM This report has been signed electronically.

## 2021-07-17 ENCOUNTER — Telehealth: Payer: Self-pay

## 2021-07-17 NOTE — Telephone Encounter (Signed)
  Follow up Call-     07/16/2021    8:59 AM  Call back number  Post procedure Call Back phone  # [phone]  Permission to leave phone message Yes     Patient questions:  Do you have a fever, pain , or abdominal swelling? No. Pain Score  0 *  Have you tolerated food without any problems? Yes.    Have you been able to return to your normal activities? Yes.    Do you have any questions about your discharge instructions: Diet   No. Medications  No. Follow up visit  No.  Do you have questions or concerns about your Care? No.  Actions: * If pain score is 4 or above: No action needed, pain <4.

## 2021-07-19 ENCOUNTER — Telehealth: Payer: Self-pay

## 2021-07-19 NOTE — Telephone Encounter (Signed)
Spoke with pt and he is aware of Dr. Blanch Media recommendations. Pt states he really thinks he just needs to "get things moving." Reports he is going to go home and take some miralax and if he doesn't get any better then he will go to the ER. Dr. Henrene Pastor notified.

## 2021-07-19 NOTE — Telephone Encounter (Signed)
This was sent to me as "Doc.of the day". I have reviewed his colonoscopy from the other day.  Unremarkable.  Was okay the next day on follow-up call. Not sure why he is having pain.  I cannot advise further. Given the degree "6 or 7", he should present to an emergency facility for proper evaluation.  Consider [facility] or [facility], as they tend to be more efficient. Thanks Dr. Henrene Pastor

## 2021-07-19 NOTE — Telephone Encounter (Signed)
Patient Adam Wilson called in abdominal pain after eating lunch today at 11:30.  Pain 6 or 7 on scale of 10, constant.  Had a colonoscopy on Monday June 5th, and said he has not had a bowel movement since his procedure.  Has been eating fine everyday and activities are normal.  Call back [phone].

## 2021-07-19 NOTE — Telephone Encounter (Signed)
Noted
